# Patient Record
Sex: Female | Born: 1955 | Race: White | Hispanic: No | Smoking: Never smoker
Health system: Southern US, Community
[De-identification: ages and names within clinical notes are randomized; demographics above are authoritative.]

## PROBLEM LIST (undated history)

## (undated) DIAGNOSIS — F319 Bipolar disorder, unspecified: Secondary | ICD-10-CM

## (undated) DIAGNOSIS — G709 Myoneural disorder, unspecified: Secondary | ICD-10-CM

## (undated) DIAGNOSIS — I82409 Acute embolism and thrombosis of unspecified deep veins of unspecified lower extremity: Secondary | ICD-10-CM

## (undated) DIAGNOSIS — F419 Anxiety disorder, unspecified: Secondary | ICD-10-CM

## (undated) DIAGNOSIS — T7840XA Allergy, unspecified, initial encounter: Secondary | ICD-10-CM

## (undated) DIAGNOSIS — E876 Hypokalemia: Secondary | ICD-10-CM

## (undated) DIAGNOSIS — D689 Coagulation defect, unspecified: Secondary | ICD-10-CM

## (undated) DIAGNOSIS — I10 Essential (primary) hypertension: Secondary | ICD-10-CM

## (undated) DIAGNOSIS — E119 Type 2 diabetes mellitus without complications: Secondary | ICD-10-CM

## (undated) DIAGNOSIS — N189 Chronic kidney disease, unspecified: Secondary | ICD-10-CM

## (undated) DIAGNOSIS — E079 Disorder of thyroid, unspecified: Secondary | ICD-10-CM

## (undated) DIAGNOSIS — M1990 Unspecified osteoarthritis, unspecified site: Secondary | ICD-10-CM

## (undated) DIAGNOSIS — F32A Depression, unspecified: Secondary | ICD-10-CM

## (undated) DIAGNOSIS — IMO0002 Reserved for concepts with insufficient information to code with codable children: Secondary | ICD-10-CM

## (undated) HISTORY — DX: Coagulation defect, unspecified: D68.9

## (undated) HISTORY — DX: Anxiety disorder, unspecified: F41.9

## (undated) HISTORY — DX: Acute embolism and thrombosis of unspecified deep veins of unspecified lower extremity: I82.409

## (undated) HISTORY — PX: KNEE ARTHROSCOPY: SUR90

## (undated) HISTORY — DX: Myoneural disorder, unspecified: G70.9

## (undated) HISTORY — DX: Chronic kidney disease, unspecified: N18.9

## (undated) HISTORY — DX: Bipolar disorder, unspecified: F31.9

## (undated) HISTORY — DX: Depression, unspecified: F32.A

## (undated) HISTORY — DX: Type 2 diabetes mellitus without complications: E11.9

## (undated) HISTORY — DX: Allergy, unspecified, initial encounter: T78.40XA

## (undated) HISTORY — PX: KNEE SURGERY: SHX244

---

## 1999-08-12 ENCOUNTER — Emergency Department (HOSPITAL_COMMUNITY): Admission: EM | Admit: 1999-08-12 | Discharge: 1999-08-12 | Payer: Self-pay | Admitting: Emergency Medicine

## 2000-10-08 ENCOUNTER — Other Ambulatory Visit: Admission: RE | Admit: 2000-10-08 | Discharge: 2000-10-08 | Payer: Self-pay | Admitting: *Deleted

## 2003-02-08 ENCOUNTER — Other Ambulatory Visit: Admission: RE | Admit: 2003-02-08 | Discharge: 2003-02-08 | Payer: Self-pay | Admitting: Internal Medicine

## 2003-11-18 ENCOUNTER — Inpatient Hospital Stay (HOSPITAL_COMMUNITY): Admission: RE | Admit: 2003-11-18 | Discharge: 2003-11-24 | Payer: Self-pay | Admitting: Psychiatry

## 2004-05-09 ENCOUNTER — Other Ambulatory Visit: Admission: RE | Admit: 2004-05-09 | Discharge: 2004-05-09 | Payer: Self-pay | Admitting: Internal Medicine

## 2005-07-13 ENCOUNTER — Other Ambulatory Visit: Admission: RE | Admit: 2005-07-13 | Discharge: 2005-07-13 | Payer: Self-pay | Admitting: Internal Medicine

## 2005-11-28 ENCOUNTER — Emergency Department (HOSPITAL_COMMUNITY): Admission: EM | Admit: 2005-11-28 | Discharge: 2005-11-29 | Payer: Self-pay | Admitting: Emergency Medicine

## 2007-06-03 ENCOUNTER — Ambulatory Visit (HOSPITAL_COMMUNITY): Admission: RE | Admit: 2007-06-03 | Discharge: 2007-06-03 | Payer: Self-pay | Admitting: Obstetrics and Gynecology

## 2007-06-16 ENCOUNTER — Observation Stay (HOSPITAL_COMMUNITY): Admission: EM | Admit: 2007-06-16 | Discharge: 2007-06-23 | Payer: Self-pay | Admitting: Emergency Medicine

## 2007-06-24 ENCOUNTER — Encounter: Admission: RE | Admit: 2007-06-24 | Discharge: 2007-07-21 | Payer: Self-pay | Admitting: Endocrinology

## 2008-02-27 ENCOUNTER — Encounter: Payer: Self-pay | Admitting: Emergency Medicine

## 2008-02-27 ENCOUNTER — Inpatient Hospital Stay (HOSPITAL_COMMUNITY): Admission: AD | Admit: 2008-02-27 | Discharge: 2008-03-02 | Payer: Self-pay | Admitting: Obstetrics and Gynecology

## 2010-10-31 NOTE — Discharge Summary (Signed)
NAME:  Alison Cline, Alison Cline                 ACCOUNT NO.:  [id number]   MEDICAL RECORD NO.:  [id number]          PATIENT TYPE:  INP   LOCATION:  9320                          FACILITY:  [facility]   PHYSICIAN:  Randye Lobo, M.D.   DATE OF BIRTH:  June 17, 1956   DATE OF ADMISSION:  02/27/2008  DATE OF DISCHARGE:  03/02/2008                               DISCHARGE SUMMARY   ADMISSION DIAGNOSIS:  Right tuboovarian abscess.   DISCHARGE DIAGNOSES:  1. Right tuboovarian abscess.  2. Anemia.  3. Perimenopausal anovulatory bleeding.   ADMISSION HISTORY AND PHYSICAL EXAMINATION:  The patient is a 55-year-  old, para 3, perimenopausal female who presented to [facility]  with right lower quadrant pain, fever, and decreased appetite.  Approximately 2 weeks prior, the patient had undergone a saline  ultrasound in the office for a perimenopausal bleeding.  Approximately 1  week prior to admission, the patient was treated for dysuria and vaginal  discharge with Bactrim and Diflucan.   The patient's temperature at [facility] was noted to be 104  degrees Fahrenheit.   MEDICAL HISTORY:  Significant for hypothyroidism and bipolar disorder.   PAST SURGICAL HISTORY:  Significant for 3 prior cesarean sections and  bilateral tubal ligations.   PHYSICAL EXAMINATION:  ABDOMEN:  Soft and she had no evidence of  guarding.  She did have evidence of rebound in the right lower quadrant.  PELVIC:  The patient was noted to have a nonfoul-smelling pus from the  cervical os.  Cultures were done of the endometrium.  The patient had  tenderness and fluctuance in the right adnexal region.   The patient's white blood cell count was noted to be 15,000.   A CT scan documented a possible tuboovarian abscess.  The appendix  appeared to be somewhat inflamed, but the entire appendix could not be  visualized well.  Again, urinalysis documented white blood cells too  numerous to count and the presence of  bacteria.   HOSPITAL COURSE:  The patient was admitted for a suspected right  tuboovarian abscess.  She was started on ampicillin, gentamicin, and  clindamycin.  She had a followup pelvic ultrasound which documented a  right adnexal complex cyst measuring 6.1 x 4.9 x 3.1 cm.  The left ovary  was noted to be normal.  There were 2 fibroids in the uterus which  measured 2.7 and 1.7 cm in diameter.  The endometrial strip was 9 mm.  The final ultrasound report could not rule out a ruptured appendix.   The patient received IV narcotic pain control, and she had a general  surgery consult the following day by Dr. Abbey Chatters, who gave the  diagnosis of a right tuboovarian abscess as well.  He recommended a  percutaneous aspiration and drainage of the abscess if the patient did  not improve with antibiotic therapy.  An interventional radiologist  reviewed the CT scan, and thought the drainage would not be possible.  The patient's antibiotics were streamlined to include just Zosyn.   The patient continued to improve throughout her hospitalization.  Her  fever defervesced on the Zosyn therapy.  Her bowel function returned  with the administration of Senokot and Dulcolax, and the patient was  able to tolerate a regular diet.   The patient began having vaginal bleeding again during her  hospitalization.  She was started on Provera 10 mg p.o. daily.   The patient's blood cultures were negative to date upon her discharge.  Her endometrial culture documented normal vaginal flora.  Her urine  culture documented 60,000 colonies of mixed flora.  Her discharge  hemoglobin was noted to be 8.1, which was stable and well tolerated.   The patient was found to be in improved condition and ready for  discharge on March 02, 2008.   DISCHARGE INSTRUCTIONS:  1. Discharged to home.  2. The patient will take the following medications;      a.     Percocet 5/325 mg 1-2 p.o. q.4-6 h. p.r.n. pain.      b.      Ibuprofen 600 mg p.o. q.6 h. p.r.n. pain.      c.     Feosol 1 p.o. b.i.d. to t.i.d. as tolerated.      d.     Provera 10 mg p.o. daily x10 days.      e.     Flagyl 500 mg p.o. b.i.d. x2 weeks.      f.     Cefixime 200 mg p.o. b.i.d. x2 weeks.  3. The patient will follow a regular diet.  4. The patient will have no driving and no work for the next 1 week,      and the patient will not have sexual activity for the next 6 weeks.      5.  The patient will follow up in the office in 7-10 days.  5. The patient will call us if she experiences problems with recurrent      fever, nausea and vomiting, increased pain, heavy vaginal bleeding,      or any other concern.      Randye Lobo, M.D.  Electronically Signed     BES/MEDQ  D:  03/02/2008  T:  03/02/2008  Job:  [id number]

## 2010-10-31 NOTE — Consult Note (Signed)
NAME:  QUETZALI, HEINLE NO.:  [id number]   MEDICAL RECORD NO.:  [id number]          PATIENT TYPE:  OBV   LOCATION:  3020                         FACILITY:  [facility]   PHYSICIAN:  Cristi Loron, M.D.DATE OF BIRTH:  1956/03/01   DATE OF CONSULTATION:  06/18/2007  DATE OF DISCHARGE:                                 CONSULTATION   CHIEF COMPLAINT:  Back pain and left greater than right leg pain.   HISTORY OF PRESENT ILLNESS:  The patient is a 55 year old white female  who has had chronic intermittent back pain for 20 years.  She  described the intermittent episodes in which her back goes out.  Her  most recent episode began about 3 days ago when she bent over to pick  her grandson.  She had an acute onset of low back pain with pain into  her left greater than right buttocks.  The patient presented to the  emergency department 2 days ago with severe pain, was admitted for IV  pain control as well as further workup by Dr. Dagoberto Ligas.  The workup has  included a lumbar MRI scan, which demonstrated an annular tear at L4-L5  and herniated disc at L5-S1 and a neurosurgical consultation was  requested.   Presently, the patient is accompanied by her husband.  She complains of  midline low back pain at the lumbosacral junction with pain into her  left greater than right buttock.  She does not have any numbness or  tingling or pain radiating down her legs.  She feels in general she is  a little better.  She has not had any physical therapy or injections  into these except in the past, she has seen chiropractors intermittently  with some success.   PAST MEDICAL HISTORY:  Positive for bipolar disease, hypothyroidism, and  knee osteoarthritis.   PAST SURGICAL HISTORY:  Cesarean sections x3 and knee arthroscopy.   PRESENT MEDICATIONS:  1. Synthroid 50 mcg p.o. daily.  2. Demadex 20 mg p.o. daily.  3. K-Dur 20 mg p.o. daily.  4. Zyloprim 100 mg p.o. daily.  5. Wellbutrin 450 mg  p.o. daily.  6. Prozac 60 mg p.o. daily.  7. Sulfacetamide 10% ophthalmic solution 1 drop both eyes t.i.d.  8. MiraLax 17 g p.o. daily.  9. Xanax 1 mg p.o. p.r.n.  10.Restoril 60 mg p.o. p.r.n.  11.Morphine p.r.n.  12.Tylenol.  13.Klonopin 1 mg p.o. t.i.d.   DRUG ALLERGIES:  ZYPREXA.   FAMILY MEDICAL HISTORY:  The patient's mother is aged 12 in good health.  The patient's father is aged 20 with lung cancer.   SOCIAL HISTORY:  The patient is married.  She lives in [place].  She  has 3 children.  She is disabled secondary to her bipolar disorder.  She  denies tobacco or drug use.  She occasionally drinks alcohol.   REVIEW OF SYSTEMS:  Negative except as above.   PHYSICAL EXAMINATION:  GENERAL:  A pleasant 55 year old white female in  no apparent distress.  HEENT:  Normocephalic, atraumatic.  Pupils equal, round, and reactive to  light.  Extraocular muscles are intact.  Oropharynx benign.  Uvula  midline.  NECK:  Supple.  There are no masses, deformities, or tracheal deviation.  She has a fairly normal cervical range of motion.  Thorax is symmetric.  ABDOMEN:  Soft.  EXTREMITIES:  No obvious deformities.  BACK:  There is no point tenderness or deformities.  Pearlean Brownie testing is  negative bilaterally.  Straight leg raise testing is negative on the  right.  Positive on the left.  NEUROLOGIC:  The patient is alert and oriented x3.  Cranial nerves II  through XII are examined bilaterally and grossly normal.  Vision and  hearing are grossly normal bilaterally.  Motor strength is 5/5 in  bilateral deltoid, biceps, triceps, handgrip, wrist extensor, psoas,  quadriceps, gastrocnemius, and extensor hallucis longus.  Deep tendon  reflexes are 2/4 in bilateral biceps, triceps, brachioradialis,  quadriceps, and gastrocnemius.  There is no ankle clonus.  Cerebellar  function is intact to rapid alternating movements of the upper  extremities bilaterally.  Sensory exam is intact to light  touch  sensation in all tested dermatomes bilaterally.   IMAGING STUDIES:  I reviewed the patient's lumbar spine and x-rays  performed at [facility] on June 16, 2007; it demonstrates  the patient has mild to moderate thoracolumbar scoliosis, otherwise  unremarkable.   I also reviewed the patient's lumbar spine and MRI performed on June 17, 2007, without contrast at [facility].  The sagittal image  demonstrates that the patient has some diffuse mild bulging disc.  She  has annular tear at L4-L5 on the right.   On the axial images at L2-L3 and L3-L4, it demonstrates some minimal  central bulging; L4-L5 has a right-sided annular tear in the  neuroforamen.  She has a mild central bulging disc.  She has bilateral  facet arthropathy.  At L5-S1, there is a small to moderate left  herniated disc with some compression of the left thecal sac and left S1  nerve root and bilateral facet arthropathy.   ASSESSMENT AND PLAN:  Lumbago, lumbar radiculopathy, left L5-S1  herniated disc, right L4-L5 annular tear, facet arthropathy, disc  degeneration, etc.   I discussed the situation with the patient and her husband; (at the  patient's request), I described the above MRI findings.  I told her I  thought she is likely more symptomatic from the small to moderate left  herniated disc at L5-S1 suggesting left S1 radiculopathy, but she may  also be symptomatic from the annular tear at L4-L5 on the right causing  a right L4 radiculopathy.  In any event, there is no severe neural  compression, and she seems to be improving with medical management.  We  will start her on some IV and then p.o. steroids to help with any  inflammation and a muscle relaxer i.e., Flexeril.  We will continue with  pain medications as needed.  I will ask the physical therapist to work  with her and mobilize her.  I think she will likely improve with the  above measures.  If  not, another option would be  lumbar epidural injection.  If we can do  it, she may benefit from left L5-S1 microdiscectomy.  I have answered  all of the patient's questions.  I have given them my card to follow up  with me in the office after discharge.      Cristi Loron, M.D.  Electronically Signed     JDJ/MEDQ  D:  06/18/2007  T:  06/19/2007  Job:  [id number]

## 2010-10-31 NOTE — H&P (Signed)
NAME:  Alison Cline, Alison Cline                 ACCOUNT NO.:  [id number]   MEDICAL RECORD NO.:  [id number]          PATIENT TYPE:  INP   LOCATION:  3020                         FACILITY:  [facility]   PHYSICIAN:  Alfonse Alpers. Gegick, M.D.DATE OF BIRTH:  [birth year]   DATE OF ADMISSION:  06/16/2007  DATE OF DISCHARGE:                              HISTORY & PHYSICAL   CHIEF COMPLAINT:  This is a 55 year old woman who presents with a  history of severe back pain.   HISTORY OF PRESENT ILLNESS:  The patient has had a history of  intermittent back pain for approximately 20 years.  She has been seen by  a chiropractor who apparently treats her periodically.  She has not had  any severe back pains in the past.  Two days prior to this admission,  she was bending over to pick a child up, the child weighed approximately  25 pounds, and she had acute onset of back pain.  She was able to get  about that evening, but on the morning of this admission, the pain is  becoming quite severe, and she is unable to move.  She was brought into  the emergency room by EMS and is in severe pain and unable to move.  She  was invited to be discharged, however, she declined because of the  severe back pain.  She is unable to go to the bathroom.   PAST MEDICAL HISTORY:  Relatively benign.  She does have a history of  hypothyroidism which is compensated.  In addition, she has a history of  gout and bipolar disorder.   MEDICATIONS PRIOR TO THIS ADMISSION:  Include:  1. Temazepam 15 mg 4 h.s.  2. Synthroid 0.05 mg 1 q.d.  3. Demadex 20 mg 1 q.d.  4. Potassium 20 mEq 1 q.d.  5. Klonopin 1 mg 3 times a day p.r.n.  6. Allopurinol 100 mg 1 daily  7. Iron pills 3 times weekly  8. Wellbutrin 150 mg 3 daily.  9. Prozac 20 mg 3 times a day.  10.Xanax 1 mg 3 times a day p.r.n.  11.She is currently using eye drops for conjunctivitis.   PAST MEDICAL HISTORY:  Essentially negative except for a history of  thrombophlebitis.   ALLERGIES:   SHE IS ALLERGIC TO ZYPREXA.   REVIEW OF SYSTEMS:  Her weight has been stable.  Cardiovascular and  respiratory  are negative.  GI:  Negative.  GU:  Negative.   PHYSICAL EXAMINATION:  GENERAL:  This is a well-developed woman who  appears comfortable while lying down.  HEENT:  Her head is normocephalic.  NECK:  Supple.  LUNGS:  Clear.  CARDIOVASCULAR:  Rhythm is regular.  BACK:  Mild tenderness present in the L4/5 area.  Straight leg raising  is positive on the left.  On the right, she is able to move almost 90  degrees without discomfort.  Hip moves without difficulty.  No muscle  strength loss is present.  Reflexes are diminished.   IMPRESSION ON ADMISSION:  1. Acute back pain secondary to ruptured disk on the left.  2.  History of hypothyroidism.  3. History of gout.  4. History of bipolar disorder.           ______________________________  Alfonse Alpers. Dagoberto Ligas, M.D.     CGG/MEDQ  D:  06/16/2007  T:  06/17/2007  Job:  [id number]

## 2010-10-31 NOTE — Consult Note (Signed)
NAME:  Alison Cline, Alison Cline                 ACCOUNT NO.:  [id number]   MEDICAL RECORD NO.:  [id number]          PATIENT TYPE:  INP   LOCATION:  9320                          FACILITY:  [facility]   PHYSICIAN:  Adolph Pollack, M.D.DATE OF BIRTH:  [birth year]   DATE OF CONSULTATION:  02/28/2008  DATE OF DISCHARGE:                                 CONSULTATION   CONSULTING PHYSICIAN:  Randye Lobo, MD   REASON:  Right lower quadrant abscess, rule out ruptured appendix.   HISTORY OF PRESENT ILLNESS:  This is a 55 year old female with  approximately 7-day history of right lower quadrant and pelvic pain that  progressively worsened and was associated with fever.  The pain became  so bad that she presented to the hospital and was admitted on February 27, 2008.  She is noted to have a leukocytosis with a white blood cell  count of 15,000.  She had some nausea, no vomiting, and some  constipation.  She underwent a CT scan of the abdomen and pelvis.  This  demonstrated a right lower quadrant/pelvic inflammatory process.  There  appeared to hydrosalpinx as well.  Appendix could not be visualized.  The thought was this could be a pyosalpinx with tubal ovarian abscess,  but ruptured appendicitis cannot be ruled out.  I was asked to see her.  Currently, she states her fevers are better, but she is still having  some of the pain.   PAST MEDICAL HISTORY:  1. Bipolar disorder.  2. Hypothyroidism.  3. Hyperprolactinemia.  4. Herniated L5-S1 disk.  5. Left lower extremity deep venous thrombosis.   PREVIOUS OPERATIONS:  1. Three cesarean sections and with a tubal ligation.  2. Knee arthroscopy.   ALLERGIES:  Include ZYPREXA.   Medications at home include Abilify, Lexapro, potassium, Klonopin,  Synthroid, Wellbutrin, BuSpar, and Bactrim DS for recent urinary tract  infection.   SOCIAL HISTORY:  Married and is here with her husband.  Denies any  tobacco use.   REVIEW OF SYSTEMS:  CARDIOVASCULAR:  She  denies hypertension or heart  disease.  PULMONARY:  No pneumonia or shortness of breath.  GI:  No  peptic ulcer disease.  No diverticulitis.  No colitis.  No melena or  hematochezia.  GU:  She has no kidney stones and denies recurrent  urinary tract infections.  HEMATOLOGIC:  A left lower extremity DVT in  the past.  No bleeding disorders.   PHYSICAL EXAMINATION:  GENERAL:  A mildly overweight female in no acute  distress, very pleasant and cooperative, husband is in the room.  VITAL SIGNS:  Temperature is 97.7, blood pressure is 90/55, pulse 67, O2  saturations 100% on room air.  RESPIRATORY:  Breath sounds equal and clear.  Respirations unlabored.  CARDIOVASCULAR:  Regular rate and rhythm.  I do not hear a murmur.  ABDOMEN:  Soft with a low transverse scar present and hypoactive bowel  sounds noted.  There is right pelvic tenderness and guarding.  There is  mild right lower quadrant tenderness at McBurney point, but no guarding.  No masses were palpable.  LABORATORY DATA:  Notable for white blood cell count today of 13,600,  and hemoglobin 10.3.  Liver function tests normal except for low albumin  on admission.   CT scan was reviewed with radiologist.   IMPRESSION:  Right tubal ovarian abscess/pyosalpinx - I doubt the  ruptured appendicitis is the clinical presentation.  On the CT and most  of the process appears to be in the pelvic and adnexal area.   RECOMMENDATIONS:  I would change it to a single broad-spectrum  antibiotics such as Zosyn.  I would follow her clinical course and if  she is no better, I would recommend percutaneous aspiration or drainage.  If it did not improve with the antibiotics, we will repeat the CT scan  in March 02, 2008 or March 03, 2008.  I see no reason for the  need for emergency operative intervention at this time.       Adolph Pollack, M.D.  Electronically Signed     TJR/MEDQ  D:  02/28/2008  T:  02/29/2008  Job:  [id number]    cc:   Randye Lobo, M.D.  Fax: [phone]

## 2010-10-31 NOTE — Discharge Summary (Signed)
NAME:  Alison Cline, Alison Cline                 ACCOUNT NO.:  [id number]   MEDICAL RECORD NO.:  [id number]          PATIENT TYPE:  OBV   LOCATION:  3020                         FACILITY:  [facility]   PHYSICIAN:  Alfonse Alpers. Gegick, M.D.DATE OF BIRTH:  11/08/1955   DATE OF ADMISSION:  06/16/2007  DATE OF DISCHARGE:  06/22/2007                               DISCHARGE SUMMARY   HISTORY:  This is a 55 year old woman who presents with a history of  severe back pain.  She has had a history of chronic back pain for  approximately 20 years.  On the day prior to this admission, she had an  acute onset of pain while lifting up an object approximately 25 pounds.  The pain has persisted and has increased and she presents to the  emergency room with severe pain and unable to move.   She also has a history of bipolar disorder and she has been taking  medications for that.   She has a history of elevated prolactin levels.  Recently, a prolactin  level was 113 in the hospital.  Previously, she was evaluated by having  a MRI scan of the brain which was apparently negative.   Medications prior to this admission include temazepam 15 mg 4 at night,  Synthroid 0.5 mg 1 daily, Demadex 20 mg 1 daily, potassium 20 mEq 1  daily, Klonopin 1 mg 3 times a day as needed, allopurinol 100 mg 1  daily, iron pills 3 times weekly, Wellbutrin 150 mg 3 times daily,  Prozac 20 mg 3 times a day, Xanax 1 mg 3 times a day p.r.n., and she is  currently taking eye drops for recurrent conjunctivitis.   PHYSICAL EXAMINATION:  GENERAL:  Her physical examination revealed a  well-developed woman who IS experiencing severe pain.  LUNGS:  Her lungs were clear.  CARDIOVASCULAR: Examination was negative.  BACK:  Her back was mildly tender.  Straight leg raising was present  bilaterally.   IMPRESSION ON ADMISSION:  Ruptured disk.   HOSPITAL COURSE:  She was admitted to the hospital.  Her pain was so  severe that she was unable to go to the  bathroom and a Foley catheter  was inserted.  She was given morphine intravenously.  She continued to  have pain which was quite severe with some slight improvement.  She was  seen in consultation by Dr. Lovell Sheehan in the neurosurgical department.  His impression was that she had a lumbar radiculopathy with a left L5-S1  herniated disk and right L4-L5 annular tear.  She was started on  steroids and also Flexeril.  She had gradual improvement.  Physical  therapy was involved too and they continued to improve with physical  therapy.  She continued to improve and is at the point now where she is  able to get about and walk in the home with the presumption that she  will continue to improve.  The patient was then discharged.   DISCHARGE DIAGNOSES:  The diagnosis on discharge was:  (1) Ruptured  disk, L5-S1 and a right annular tear on L4-L5.  (2)  A history of  prolactin elevation.  (3) History of bipolar disorder.   MEDICATIONS ON DISCHARGE:  She will continue taking her usual  medications which include the following:  Synthroid 50 mcg p.o. daily,  Demadex 20 mg p.o. daily, K-Dur 20 mEq p.o. daily, allopurinol 100 mg  p.o. daily, Wellbutrin 150 mg 3 times a day, Prozac 20 mg 3 times a day,  Xanax 1 mg 3 times daily as needed.   DIET ON DISCHARGE:  Her diet on discharge to be as tolerated.   Her pain medication as an outpatient, she will receive Percocet and  prednisone will be tapered.   CONDITION ON DISCHARGE:  Improved.   PLANNED FOLLOWUP:  She was seen in the office in a period of 1 week.           ______________________________  Alfonse Alpers. Dagoberto Ligas, M.D.     CGG/MEDQ  D:  06/23/2007  T:  06/23/2007  Job:  [id number]

## 2010-11-03 NOTE — Discharge Summary (Signed)
NAME:  Alison Cline, Alison Cline NO.:  [id number]   MEDICAL RECORD NO.:  [id number]                   PATIENT TYPE:  IPS   LOCATION:  0501                                 FACILITY:  [facility]   PHYSICIAN:  Jeanice Lim, M.D.              DATE OF BIRTH:  [birth year]   DATE OF ADMISSION:  11/18/2003  DATE OF DISCHARGE:  11/24/2003                                 DISCHARGE SUMMARY   IDENTIFYING DATA:  This is a 55 year old married Caucasian female  voluntarily admitted with a history of depression, mood fluctuations.  Referred by psychiatrist for mood instability, getting worse over the past 3-  4 months, feeling depressed or irritable, decreased concentration, wanting  to overdose.  Had a manic episode.  Painted two apartments through the  night.  Unable to get mood stable.  Sleep is erratic, sleeping excessive  amounts and then going for days without sleep.   MEDICATIONS:  Ambien, Xanax, Effexor, Trileptal, Zomig for headache,  furosemide and K-Dur.   ALLERGIES:  No known drug allergies.   PHYSICAL EXAMINATION:  Physical exam and neurologically within normal  limits.   LABORATORY DATA:  Routine admission labs essentially within normal limits.   MENTAL STATUS EXAM:  Fully alert, pleasant, cooperative, some affective  lability.  Tearful at times.  No psychomotor abnormalities.  Speech within  normal limits.  Mood depressed, frustrated.  Affect somewhat labile.  Thought processes goal directed.  Positive for suicidal ideation with no  attempt at this time.  Cognitively intact.  Judgment and insight fair.   ADMISSION DIAGNOSES:   AXIS I:  Bipolar disorder, type 1, mixed.   AXIS II:  Deferred.   AXIS III:  1. History of migraine headaches.  2. Hypothyroidism.  3. Hypokalemia.   AXIS IV:  Moderate (stressors related to limited support system and other  psychosocial issues).   AXIS V:  29/60.   HOSPITAL COURSE:  The patient was admitted and ordered  routine p.r.n.  medications and underwent further monitoring.  Was encouraged to participate  in individual, group and milieu therapy.  The patient was placed on safety  checks for suicide prevention.  Baseline EKG was ordered.  Potassium was  replaced and routine admission labs were followed up to rule out a  reversible organic etiology of psychopathology.  The patient was adjusted on  medications.  Risk/benefit ratio and alternative treatments regarding  lithium were discussed with the patient.  The patient had significant mood  swings and was agreeable to take lithium with monitoring.  Lithium was added  and Klonopin used for acute anxiety and Trileptal was discontinued.  Gradually, patient's potassium was replaced and diarrhea also resolved.  The  patient reported significant improvement.  The patient was discharged with a  mood that was stable and euthymic.  Affect brighter.  Improved judgment and  insight.  No psychotic symptoms.  No dangerous ideation.  Reporting  motivation to be compliant with the follow-up plan, showing increased  insight regarding the importance of behavioral changes as well to further  maintain mood stability.  The patient was given medication education and,  again, reported the dangerousness of lithium and importance of close  monitoring.   DISCHARGE MEDICATIONS:  1. Potassium 20 mEq three times a day.  2. Klonopin 0.5 mg, 1/2-1 at bedtime and 1/2 tablet per day as needed for     anxiety.  3. Effexor XR 75 mg q.a.m.  4. Seroquel 100 mg q.h.s.  5. Flexeril 10 mg, 1/2 q.h.s. p.r.n. muscle spasms.  6. Lithium carbonate 450 mg, 1 q.h.s.  7. Lithium carbonate 300 mg, 1 q.a.m.  8. The patient was to continue Demadex and Synthroid and other medications     prescribed by medical doctor.   FOLLOW UP:  The patient was to see family physician for follow-up regarding  potassium within a week and Dr. Milford Cage for continued medication  monitoring.  Appointment  was made for December 01, 2003 at 9:40.   DISCHARGE DIAGNOSES:   AXIS I:  Bipolar disorder, type 1, mixed.   AXIS II:  Deferred.   AXIS III:  1. History of migraine headaches.  2. Hypothyroidism.  3. Hypokalemia.   AXIS IV:  Moderate (stressors related to limited support system and other  psychosocial issues).   AXIS V:  Global Assessment of Functioning on discharge 55.                                               Jeanice Lim, M.D.    JEM/MEDQ  D:  12/12/2003  T:  12/12/2003  Job:  [id number]

## 2011-03-19 LAB — BASIC METABOLIC PANEL
GFR calc Af Amer: >60
Potassium: 4.1

## 2011-03-19 LAB — CBC
Hemoglobin: 8.1 — ABNORMAL LOW
MCHC: 33.3
Platelets: 225

## 2011-03-19 LAB — DIFFERENTIAL
Basophils Relative: 1
Eosinophils Absolute: 0.2
Lymphocytes Relative: 26
Neutro Abs: 3.6
Neutrophils Relative %: 62

## 2011-03-21 LAB — COMPREHENSIVE METABOLIC PANEL
AST: 28
Albumin: 2.6 — ABNORMAL LOW
Alkaline Phosphatase: 70
Alkaline Phosphatase: 77
BUN: 8
CO2: 27
Calcium: 8.5
Chloride: 102
Creatinine, Ser: 1.07
GFR calc non Af Amer: 54 — ABNORMAL LOW
Glucose, Bld: 114 — ABNORMAL HIGH
Potassium: 3.9
Sodium: 134 — ABNORMAL LOW
Sodium: 136
Total Bilirubin: 0.6
Total Protein: 6.5
Total Protein: 7.2

## 2011-03-21 LAB — URINE CULTURE
Colony Count: NO GROWTH
Culture: NO GROWTH
Special Requests: NEGATIVE

## 2011-03-21 LAB — CBC
HCT: 24.6 — ABNORMAL LOW
HCT: 34.9 — ABNORMAL LOW
MCHC: 32.7
MCHC: 33.1
MCHC: 33.5
MCV: 85.4
MCV: 86.4
Platelets: 197
Platelets: 222
RBC: 3.02 — ABNORMAL LOW
RDW: 16.3 — ABNORMAL HIGH
RDW: 16.6 — ABNORMAL HIGH
WBC: 13.6 — ABNORMAL HIGH

## 2011-03-21 LAB — DIFFERENTIAL
Basophils Relative: 0
Basophils Relative: 0
Basophils Relative: 0
Eosinophils Absolute: 0.1
Eosinophils Absolute: 0.2
Eosinophils Relative: 1
Lymphocytes Relative: 5 — ABNORMAL LOW
Lymphs Abs: 1.5
Lymphs Abs: 1.6
Monocytes Relative: 6
Monocytes Relative: 8
Monocytes Relative: 8
Neutro Abs: 13.2 — ABNORMAL HIGH
Neutrophils Relative %: 65
Neutrophils Relative %: 69
Neutrophils Relative %: 88 — ABNORMAL HIGH

## 2011-03-21 LAB — CULTURE, BLOOD (ROUTINE X 2): Culture: NO GROWTH

## 2011-03-21 LAB — URINALYSIS, ROUTINE W REFLEX MICROSCOPIC
Bilirubin Urine: NEGATIVE
Protein, ur: 30 — AB
Urobilinogen, UA: 0.2

## 2011-03-21 LAB — BASIC METABOLIC PANEL
BUN: 3 — ABNORMAL LOW
BUN: 4 — ABNORMAL LOW
CO2: 30
Calcium: 8.1 — ABNORMAL LOW
Chloride: 104
Creatinine, Ser: 1.1
GFR calc non Af Amer: 52 — ABNORMAL LOW
GFR calc non Af Amer: >60
Glucose, Bld: 107 — ABNORMAL HIGH
Glucose, Bld: 98
Potassium: 3.8
Sodium: 139

## 2011-03-21 LAB — URINE MICROSCOPIC-ADD ON

## 2011-03-21 LAB — GENTAMICIN LEVEL, RANDOM: Gentamicin Rm: 4.8

## 2011-03-21 LAB — CULTURE, ROUTINE-GENITAL: Culture: NORMAL

## 2012-02-07 ENCOUNTER — Ambulatory Visit (INDEPENDENT_AMBULATORY_CARE_PROVIDER_SITE_OTHER): Payer: Medicare Other | Admitting: Obstetrics and Gynecology

## 2012-02-07 ENCOUNTER — Encounter: Payer: Self-pay | Admitting: Obstetrics and Gynecology

## 2012-02-07 ENCOUNTER — Telehealth: Payer: Self-pay | Admitting: Obstetrics and Gynecology

## 2012-02-07 VITALS — BP 102/68 | HR 70 | Ht 63.0 in | Wt 167.0 lb

## 2012-02-07 DIAGNOSIS — Q828 Other specified congenital malformations of skin: Secondary | ICD-10-CM

## 2012-02-07 DIAGNOSIS — B379 Candidiasis, unspecified: Secondary | ICD-10-CM

## 2012-02-07 DIAGNOSIS — N390 Urinary tract infection, site not specified: Secondary | ICD-10-CM

## 2012-02-07 DIAGNOSIS — Z01419 Encounter for gynecological examination (general) (routine) without abnormal findings: Secondary | ICD-10-CM

## 2012-02-07 DIAGNOSIS — Z124 Encounter for screening for malignant neoplasm of cervix: Secondary | ICD-10-CM

## 2012-02-07 DIAGNOSIS — B49 Unspecified mycosis: Secondary | ICD-10-CM

## 2012-02-07 MED ORDER — IMIQUIMOD 5 % EX CREA: TOPICAL_CREAM | CUTANEOUS | Status: DC

## 2012-02-07 MED ORDER — FLUCONAZOLE 150 MG PO TABS: 150.0000 mg | ORAL_TABLET | Freq: Every day | ORAL | Status: AC

## 2012-02-07 MED ORDER — NITROFURANTOIN MACROCRYSTAL 50 MG PO CAPS: 50.0000 mg | ORAL_CAPSULE | Freq: Every day | ORAL | Status: AC

## 2012-02-07 NOTE — Progress Notes (Signed)
Regular Periods: no Mammogram: yes  Monthly Breast Ex.: yes Exercise: yes  Tetanus < 10 years: no Seatbelts: yes  NI. Bladder Functn.: no Pt has had bladder infections over the past few months Abuse at home: no  Daily BM's: yes Stressful Work: no  Healthy Diet: yes Sigmoid-Colonoscopy: within the last year  Calcium: yes Medical problems this year: none   LAST PAP:09/2010  Contraception: BTL  Mammogram:  2012 per pt  PCP:Dr. Gaye Alken  PMH: none  FMH: none  Last Bone Scan: n/a  Subjective:    Alison Cline is a 56 y.o. female G3P3 who presents for annual exam. The patient complains of urinary tract infections after intercourse. She complains of skin tags between her legs.  Her mother recently died.  She is now divorced.  Her younger sister died from colon cancer.  The following portions of the patient's history were reviewed and updated as appropriate: allergies, current medications, past family history, past medical history, past social history, past surgical history and problem list. See above.  Review of Systems Pertinent items are noted in HPI. Gastrointestinal:No change in bowel habits, no abdominal pain, no rectal bleeding Genitourinary:negative for dysuria, frequency, hematuria, nocturia and urinary incontinence    Objective:     BP 102/68  Pulse 70  Ht 5\' 3"  (1.6 m)  Wt 167 lb (75.751 kg)  BMI 29.58 kg/m2  Weight:  Wt Readings from Last 1 Encounters:  02/07/12 167 lb (75.751 kg)     BMI: Body mass index is 29.58 kg/(m^2). General Appearance: Alert, appropriate appearance for age. No acute distress HEENT: Grossly normal Neck / Thyroid: Supple, no masses, nodes or enlargement Lungs: clear to auscultation bilaterally Back: No CVA tenderness Breast Exam: No masses or nodes.No dimpling, nipple retraction or discharge. Cardiovascular: Regular rate and rhythm. S1, S2, no murmur Gastrointestinal: Soft, non-tender, no masses or  organomegaly  ++++++++++++++++++++++++++++++++++++++++++++++++++++++++  Pelvic Exam: External genitalia: normal general appearance Vaginal: normal without tenderness, induration or masses and relaxation noted Cervix: normal appearance Adnexa: normal bimanual exam Uterus: upper limits normal size Rectovaginal: normal rectal, no masses  ++++++++++++++++++++++++++++++++++++++++++++++++++++++++  Lymphatic Exam: Non-palpable nodes in neck, clavicular, axillary, or inguinal regions  Psychiatric: Alert and oriented, appropriate affect.    Urinalysis:Not done      Assessment:    Normal gyn exam   Post coital urinary tract infections  Skin tags  Overweight or obese: Yes  Pelvic relaxation: Yes  Menopausal symptoms: No. Severe: No.   Plan:    Mammogram. Pap smear. patient needs repeat colonoscopy   Aldara to skin tags.  Patient understands that this medication is normally used for condylomata.  Nitrofurantoin 50 mg prior to intercourse.  Diflucan if needed for yeast infection.  Follow-up:  for annual exam  STD screen request: none,   The updated Pap smear screening guidelines were discussed with the patient. The patient requested that I obtain a Pap smear: Yes.  Kegel exercises discussed: Yes.  Proper diet and regular exercise were reviewed.  Annual mammograms recommended starting at age 15. Proper breast care was discussed.  Screening colonoscopy is recommended beginning at age 22.  Regular health maintenance was reviewed.  Mylinda Latina.D.

## 2012-02-07 NOTE — Telephone Encounter (Signed)
AVS,THIS PT SAW YOU TODAY AND SHE STATES SHE HAD A LOT OF QUESTIONS FOR YOU TODAY ,BUT THE ONE QUESTION SHE FORGOT TO ASK YOU WAS SHE IS 56 YEARS OLD AND X 3 MONTHS SHE HAS BEEN HAVING SOME PAIN WITH INTERCOURSE AND WANT TO KNOW WHAT ARE YOUR THOUGHTS ON THAT SUBJECT. PT STATES LUBRICATION IS NOT THE PROBLEM AND SHE JUST WANTED TO SPEAK WITH YOU AGAIN IF POSSIBLE.

## 2012-02-07 NOTE — Telephone Encounter (Signed)
TRIAGE/GENERAL QUEST./ISSUE HAS BEEN ADDRESSED

## 2012-02-08 LAB — PAP IG W/ RFLX HPV ASCU

## 2012-02-09 NOTE — Telephone Encounter (Signed)
Menopause, lack of estrogen in the vagina.  Dr. Stefano Gaul

## 2012-02-11 NOTE — Telephone Encounter (Signed)
TC TO PT REGARDING QUESTION SHE HAD FOR AVS. PER AVS INFORMED PT THAT THE PAIN DURING I/C CAN BE FROM THE LACK OF ESTROGEN IN THE VAGINA DUE TO MENOPAUSE.  I TOLD PT TO TRY [place] JELLY OR INTRIGUE TO HELP WITH LUBRICATION AND IF IT DID NOT WORK TO COME IN TO BE EVAL FOR VAGINAL DRYNESS. PT VOICED UNDERSTANDING.

## 2012-02-14 ENCOUNTER — Telehealth: Payer: Self-pay | Admitting: Obstetrics and Gynecology

## 2012-02-14 NOTE — Telephone Encounter (Signed)
Patient will be called with her appointment to come in for TCA treatment.  Dr. Stefano Gaul

## 2012-02-26 ENCOUNTER — Telehealth: Payer: Self-pay | Admitting: Obstetrics and Gynecology

## 2012-02-26 NOTE — Telephone Encounter (Signed)
TRIAGE/BC

## 2012-02-27 ENCOUNTER — Telehealth: Payer: Self-pay

## 2012-02-27 NOTE — Telephone Encounter (Signed)
Per AVS pt can come in the office for skin tags. Pt called and made aware. Pt has appt scheduled for 9-16-*13 @ 11:30am. Pt voiced understanding.  [facility] CMA

## 2012-02-28 ENCOUNTER — Telehealth: Payer: Self-pay | Admitting: Obstetrics and Gynecology

## 2012-02-28 NOTE — Telephone Encounter (Signed)
Triage/tst req

## 2012-02-28 NOTE — Telephone Encounter (Signed)
Lm on vm tcb rgd msg 

## 2012-03-03 ENCOUNTER — Encounter: Payer: Self-pay | Admitting: Obstetrics and Gynecology

## 2012-03-03 ENCOUNTER — Ambulatory Visit (INDEPENDENT_AMBULATORY_CARE_PROVIDER_SITE_OTHER): Payer: Medicare Other | Admitting: Obstetrics and Gynecology

## 2012-03-03 VITALS — BP 120/64 | Resp 18 | Wt 172.0 lb

## 2012-03-03 DIAGNOSIS — A630 Anogenital (venereal) warts: Secondary | ICD-10-CM

## 2012-03-03 NOTE — Progress Notes (Addendum)
HISTORY OF PRESENT ILLNESS  [name] is a 56 y.o. year old female,G3P3, who presents for a problem visit. 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  Subjective:  The patient was told that her prescription for Aldara will cost $400. She says that she cannot afford this.  Objective:  BP 120/64  Resp 18  Wt 172 lb (78.019 kg)   General: no distress  External genitalia: warts noted at the right upper labia minor normal.  Procedure:  Procedure discussed with the patient.  Vaseline was placed on the normal skin surrounding the condylomata.  15% TCA was placed on the lesions.  The patient tolerated her procedure well.  Assessment:  condylomata  Plan:  The patient will return in one to 2 weeks if the lesions have not resolved.  Return to office prn if symptoms worsen or fail to improve.   Leonard Schwartz M.D.  03/03/2012 12:42 PM

## 2012-03-04 ENCOUNTER — Encounter (HOSPITAL_BASED_OUTPATIENT_CLINIC_OR_DEPARTMENT_OTHER): Payer: Self-pay | Admitting: *Deleted

## 2012-03-04 ENCOUNTER — Emergency Department (HOSPITAL_BASED_OUTPATIENT_CLINIC_OR_DEPARTMENT_OTHER)
Admission: EM | Admit: 2012-03-04 | Discharge: 2012-03-05 | Disposition: A | Discharge status: Home / Self Care | Payer: Medicare Other | Attending: Emergency Medicine | Admitting: Emergency Medicine

## 2012-03-04 DIAGNOSIS — Z888 Allergy status to other drugs, medicaments and biological substances status: Secondary | ICD-10-CM | POA: Insufficient documentation

## 2012-03-04 DIAGNOSIS — X58XXXA Exposure to other specified factors, initial encounter: Secondary | ICD-10-CM | POA: Insufficient documentation

## 2012-03-04 DIAGNOSIS — T783XXA Angioneurotic edema, initial encounter: Secondary | ICD-10-CM

## 2012-03-04 DIAGNOSIS — F319 Bipolar disorder, unspecified: Secondary | ICD-10-CM | POA: Insufficient documentation

## 2012-03-04 MED ORDER — FAMOTIDINE 20 MG PO TABS
40.0000 mg | ORAL_TABLET | Freq: Once | ORAL | Status: AC
  Administered 2012-03-05: 40 mg via ORAL
  Filled 2012-03-04 (×2): qty 1

## 2012-03-04 MED ORDER — DIPHENHYDRAMINE HCL 25 MG PO CAPS
50.0000 mg | ORAL_CAPSULE | Freq: Once | ORAL | Status: AC
  Administered 2012-03-05: 50 mg via ORAL
  Filled 2012-03-04: qty 2

## 2012-03-04 NOTE — ED Notes (Signed)
Started on neurotin 2 weeks ago, pt started to develop raw swollen tongue yesterday,  And today became considered when it continued to swell. Pt was seen by pcp today and sent her for iv steroids. Airway intact, pt does report difficulty swallowing

## 2012-03-04 NOTE — ED Notes (Addendum)
Yesterday her tongue was sore. Today she has had swelling of her tongue. Was seen by her MD before coming here and given Prednisone for angioedema.

## 2012-03-04 NOTE — ED Provider Notes (Signed)
History     CSN: [id number]  Arrival date & time 03/04/12  2111   First MD Initiated Contact with Patient 03/04/12 2316      Chief Complaint  Patient presents with  . Oral Swelling    (Consider location/radiation/quality/duration/timing/severity/associated sxs/prior treatment) HPI This is a 56 year old white female who complains of swelling of the tongue. She noticed some oral discomfort yesterday. This morning about 6 AM she felt sensation that her tongue was swelling and it gradually and reached throughout the day. She was having difficulty swallowing or speaking due to this. She contacted her dentist who recommended some treatment for dry mouth which did not improve her symptoms. She was seen at an [place] walk-in clinic given intramuscular Solu-Medrol 40 mg. She has had significant improvement since that time. She was told she needed to come here for IV medications including Benadryl. She denies any associated nausea, vomiting, diarrhea, dyspnea or wheezing. She is not on an ACE inhibitor or angiotensin receptor blocker.  Past Medical History  Diagnosis Date  . Bipolar disorder     History reviewed. No pertinent past surgical history.  No family history on file.  History  Substance Use Topics  . Smoking status: Never Smoker   . Smokeless tobacco: Not on file  . Alcohol Use: No    OB History    Grav Para Term Preterm Abortions TAB SAB Ect Mult Living   3 3        3       Review of Systems  All other systems reviewed and are negative.    Allergies  Ambien and Zyprexa  Home Medications   Current Outpatient Rx  Name Route Sig Dispense Refill  . ALPRAZOLAM 2 MG PO TABS Oral Take 2 mg by mouth at bedtime as needed. Pt takes 1/2 am, 1/2 lunch, 1 whole tablet at bedtime    . AMPHETAMINE-DEXTROAMPHET ER 20 MG PO CP24 Oral Take 20 mg by mouth daily as needed.    Marland Kitchen CALCIUM-VITAMIN D 600-400 MG-UNIT PO TABS Oral Take by mouth.    Marland Kitchen CALCIUM CARBONATE 600 MG PO TABS Oral  Take 600 mg by mouth 2 (two) times daily with a meal.    . LAMOTRIGINE 100 MG PO TABS Oral Take 100 mg by mouth 2 (two) times daily. Anti-seizure medication.  Consult needed.    Marland Kitchen LEVOTHYROXINE SODIUM 75 MCG PO TABS Oral Take 75 mcg by mouth daily.    . CENTRUM SILVER ULTRA WOMENS PO Oral Take by mouth.    Marland Kitchen OVER THE COUNTER MEDICATION  Pt takes Mushroom Complete 8 bid    . POTASSIUM CHLORIDE 20 MEQ PO PACK Oral Take 20 mEq by mouth daily.    Marland Kitchen TEMAZEPAM 30 MG PO CAPS Oral Take 30 mg by mouth at bedtime as needed.    . TORSEMIDE 20 MG PO TABS Oral Take 20 mg by mouth daily.    . TRAZODONE HCL 50 MG PO TABS Oral Take 50 mg by mouth at bedtime.    Marland Kitchen METFORMIN HCL 500 MG PO TABS Oral Take 500 mg by mouth 2 (two) times daily with a meal.    . SIMVASTATIN 10 MG PO TABS Oral Take 10 mg by mouth at bedtime.      BP 142/72  Pulse 62  Temp 98.2 F (36.8 C) (Oral)  Resp 20  SpO2 100%  Physical Exam General: Well-developed, well-nourished female in no acute distress; appearance consistent with age of record HENT: normocephalic, atraumatic; mild angioedema  of tongue; normal voice Eyes: pupils equal round and reactive to light; extraocular muscles intact Neck: supple Heart: regular rate and rhythm Lungs: clear to auscultation bilaterally Abdomen: soft; nondistended; nontender; bowel sounds present Extremities: No deformity; full range of motion Neurologic: Awake, alert and oriented; motor function intact in all extremities and symmetric; no facial droop Skin: Warm and dry Psychiatric: Normal mood and affect    ED Course  Procedures (including critical care time)     MDM  12:48 AM Continued improvement after Benadryl and Pepcid. The trigger for this is not obvious but likely allergic. She has a history of angioedema. As noted she is not on ACE inhibitor or angiotensin receptor blocker.         Hanley Seamen, MD 03/05/12 [phone]

## 2012-03-05 MED ORDER — FAMOTIDINE 20 MG PO TABS: ORAL_TABLET | ORAL | Status: DC

## 2012-03-05 MED ORDER — PREDNISONE 50 MG PO TABS: ORAL_TABLET | ORAL | Status: DC

## 2012-03-05 MED ORDER — DIPHENHYDRAMINE HCL 50 MG PO CAPS: ORAL_CAPSULE | ORAL | Status: DC

## 2012-03-13 ENCOUNTER — Other Ambulatory Visit: Payer: Self-pay | Admitting: Family Medicine

## 2012-03-13 DIAGNOSIS — Z1231 Encounter for screening mammogram for malignant neoplasm of breast: Secondary | ICD-10-CM

## 2012-04-02 ENCOUNTER — Ambulatory Visit
Admission: RE | Admit: 2012-04-02 | Discharge: 2012-04-02 | Disposition: A | Discharge status: Home / Self Care | Payer: Medicare Other | Source: Ambulatory Visit | Attending: Family Medicine | Admitting: Family Medicine

## 2012-04-02 DIAGNOSIS — Z1231 Encounter for screening mammogram for malignant neoplasm of breast: Secondary | ICD-10-CM

## 2012-04-08 ENCOUNTER — Other Ambulatory Visit: Payer: Self-pay | Admitting: Family Medicine

## 2012-04-08 DIAGNOSIS — R928 Other abnormal and inconclusive findings on diagnostic imaging of breast: Secondary | ICD-10-CM

## 2012-04-10 ENCOUNTER — Ambulatory Visit
Admission: RE | Admit: 2012-04-10 | Discharge: 2012-04-10 | Disposition: A | Discharge status: Home / Self Care | Payer: Medicare Other | Source: Ambulatory Visit | Attending: Family Medicine | Admitting: Family Medicine

## 2012-04-10 DIAGNOSIS — R928 Other abnormal and inconclusive findings on diagnostic imaging of breast: Secondary | ICD-10-CM

## 2012-05-01 ENCOUNTER — Other Ambulatory Visit: Payer: Self-pay | Admitting: *Deleted

## 2012-05-01 DIAGNOSIS — I83893 Varicose veins of bilateral lower extremities with other complications: Secondary | ICD-10-CM

## 2012-05-28 ENCOUNTER — Encounter: Payer: Self-pay | Admitting: Vascular Surgery

## 2012-05-29 ENCOUNTER — Encounter (INDEPENDENT_AMBULATORY_CARE_PROVIDER_SITE_OTHER): Payer: Medicare Other | Admitting: *Deleted

## 2012-05-29 ENCOUNTER — Ambulatory Visit (INDEPENDENT_AMBULATORY_CARE_PROVIDER_SITE_OTHER): Payer: Medicare Other | Admitting: Vascular Surgery

## 2012-05-29 ENCOUNTER — Encounter: Payer: Self-pay | Admitting: Vascular Surgery

## 2012-05-29 VITALS — BP 133/82 | HR 76 | Resp 16 | Ht 63.0 in | Wt 160.6 lb

## 2012-05-29 DIAGNOSIS — I83893 Varicose veins of bilateral lower extremities with other complications: Secondary | ICD-10-CM

## 2012-05-29 NOTE — Progress Notes (Signed)
[facility] [place] HISTORY AND PHYSICAL   History of Present Illness:  Patient is a 56 y.o. year old female who presents for evaluation of painful varicose veins with some numbness on the right aspect of her leg.  The patient states she has had these symptoms for approximately 6 months. She does have a history of a thrombophlebitis in 1975 after a leg injury. She has a history of chronic back pain. She is followed by a chiropractor. She denies any nonhealing ulcers. She denies history of smoking. She denies hypercoagulable state. She does have a family history of varicose veins in her mother. Other medical problems include bipolar disorder, diabetes. These are currently stable.  Past Medical History  Diagnosis Date  . Bipolar disorder   . Diabetes mellitus without complication   . DVT (deep venous thrombosis)     Past Surgical History  Procedure Date  . Cesarean section     X3  . Knee surgery      Social History History  Substance Use Topics  . Smoking status: Never Smoker   . Smokeless tobacco: Never Used  . Alcohol Use: No     Comment: stopped using in 12/2011    Family History Family History  Problem Relation Age of Onset  . Cancer Mother     lukemia  . Hypertension Mother   . Other Mother     varicose veins  . Cancer Father     prostate  . Cancer Sister     colon  . Cancer Brother     renal  . Heart attack Brother     X2  . Heart disease Brother   . Hypertension Brother   . Heart attack Brother   . Heart disease Brother     Allergies  Allergies  Allergen Reactions  . Ambien (Zolpidem) Other (See Comments)    Sleep walk, eat, and drive.  . Zyprexa (Olanzapine) Itching and Swelling    Causes throat to swell     Current Outpatient Prescriptions  Medication Sig Dispense Refill  . alprazolam (XANAX) 2 MG tablet Take 2 mg by mouth at bedtime as needed. Pt takes 1/2 am, 1/2 lunch, 1 whole tablet at bedtime      .  amphetamine-dextroamphetamine (ADDERALL XR) 20 MG 24 hr capsule Take 20 mg by mouth daily as needed.      . Calcium Carb-Cholecalciferol (CALCIUM-VITAMIN D) 600-400 MG-UNIT TABS Take by mouth.      . calcium carbonate (OS-CAL) 600 MG TABS Take 600 mg by mouth 2 (two) times daily with a meal.      . lamoTRIgine (LAMICTAL) 100 MG tablet Take 100 mg by mouth 2 (two) times daily. Anti-seizure medication.  Consult needed.      Marland Kitchen levothyroxine (SYNTHROID, LEVOTHROID) 75 MCG tablet Take 75 mcg by mouth daily.      . Multiple Vitamins-Minerals (CENTRUM SILVER ULTRA WOMENS PO) Take by mouth.      Marland Kitchen OVER THE COUNTER MEDICATION Pt takes Mushroom Complete 8 bid      . potassium chloride (KLOR-CON) 20 MEQ packet Take 20 mEq by mouth daily.      Marland Kitchen rOPINIRole (REQUIP) 0.25 MG tablet Take 0.25 mg by mouth 2 (two) times daily.      . temazepam (RESTORIL) 30 MG capsule Take 30 mg by mouth at bedtime as needed.      . torsemide (DEMADEX) 20 MG tablet Take 20 mg by mouth daily.      Marland Kitchen  traZODone (DESYREL) 50 MG tablet Take 50 mg by mouth at bedtime.      . diphenhydrAMINE (BENADRYL) 50 MG capsule Take 2 capsules every 6 hours for the next 3 days then as needed for tongue swelling.      . famotidine (PEPCID) 20 MG tablet Take 1 tablet twice daily for the next 3 days then as needed for tongue swelling.      . metFORMIN (GLUCOPHAGE) 500 MG tablet Take 500 mg by mouth 2 (two) times daily with a meal.      . predniSONE (DELTASONE) 50 MG tablet Take tablet on the afternoon of Wednesday, September 18.  1 tablet  0  . simvastatin (ZOCOR) 10 MG tablet Take 10 mg by mouth at bedtime.        ROS:   General:  No weight loss, Fever, chills  HEENT: No recent headaches, no nasal bleeding, no visual changes, no sore throat  Neurologic: No dizziness, blackouts, seizures. No recent symptoms of stroke or mini- stroke. No recent episodes of slurred speech, or temporary blindness.  Cardiac: No recent episodes of chest  pain/pressure, no shortness of breath at rest.  No shortness of breath with exertion.  Denies history of atrial fibrillation or irregular heartbeat  Vascular: No history of rest pain in feet.  No history of claudication.  No history of non-healing ulcer, No history of DVT   Pulmonary: No home oxygen, no productive cough, no hemoptysis,  No asthma or wheezing  Musculoskeletal:  [ ]  Arthritis, [ x] Low back pain,  [ ]  Joint pain  Hematologic:No history of hypercoagulable state.  No history of easy bleeding.  No history of anemia  Gastrointestinal: No hematochezia or melena,  No gastroesophageal reflux, no trouble swallowing  Urinary: [ ]  chronic Kidney disease, [ ]  on HD - [ ]  MWF or [ ]  TTHS, [ ]  Burning with urination, [ ]  Frequent urination, [ ]  Difficulty urinating;   Skin: No rashes  Psychological: No history of anxiety,  No history of depression   Physical Examination  Filed Vitals:   05/29/12 1103  BP: 133/82  Pulse: 76  Resp: 16  Height: 5\' 3"  (1.6 m)  Weight: 160 lb 9.6 oz (72.848 kg)  SpO2: 100%    Body mass index is 28.45 kg/(m^2).  General:  Alert and oriented, no acute distress HEENT: Normal Neck: No bruit or JVD Pulmonary: Clear to auscultation bilaterally Cardiac: Regular Rate and Rhythm without murmur Abdomen: Soft, non-tender, non-distended, no mass Skin: No rash, scattered 1-3 mm diameter varicosities left popliteal fossa and left lateral segment of knee, few scattered small spider-type varicosities right anterior thigh Extremity Pulses:  2+ radial, brachial, femoral, dorsalis pedis, posterior tibial pulses bilaterally Musculoskeletal: No deformity or edema  Neurologic: Upper and lower extremity motor 5/5 and symmetric  DATA: The patient had a venous duplex exam today which I reviewed and interpreted. This showed no evidence of DVT. She did have mild reflux of the right common femoral vein in the right saphenofemoral junction. However the remainder of the  venous system on the right side was competent. She did have a short segment of reflux in the distal left was greater saphenous vein that overall this was a fairly normal study.  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 Mild evidence of superficial venous reflux but not severe enough to warrant ablation.  I do not believe these are the cause of the symptoms on her right anterior thigh.  PLAN:  Pathophysiology of venous reflux was discussed with the patient today. I agree that she should be and lower extremity compression stockings. We also had her veins nurse discuss with her the possibility of sclerotherapy for her reticular and spider veins. She will followup with her veins nurse if she wishes treatment of these in the future. Otherwise she will followup on as-needed basis.  Fabienne Bruns, MD [facility] [place] Office: [phone] Pager: [phone]

## 2012-06-03 ENCOUNTER — Other Ambulatory Visit: Payer: Self-pay | Admitting: Neurosurgery

## 2012-06-03 DIAGNOSIS — M545 Low back pain: Secondary | ICD-10-CM

## 2012-06-27 ENCOUNTER — Ambulatory Visit
Admission: RE | Admit: 2012-06-27 | Discharge: 2012-06-27 | Disposition: A | Discharge status: Home / Self Care | Payer: Medicare Other | Source: Ambulatory Visit | Attending: Neurosurgery | Admitting: Neurosurgery

## 2012-06-27 DIAGNOSIS — M545 Low back pain: Secondary | ICD-10-CM

## 2012-08-28 ENCOUNTER — Telehealth: Payer: Self-pay | Admitting: Obstetrics and Gynecology

## 2012-08-28 ENCOUNTER — Other Ambulatory Visit: Payer: Self-pay | Admitting: Obstetrics and Gynecology

## 2012-08-28 DIAGNOSIS — N6489 Other specified disorders of breast: Secondary | ICD-10-CM

## 2012-08-28 NOTE — Telephone Encounter (Signed)
TC to pt confirmed Mammogram was done at [facility] Pt has Mammogram scheduled for 09/2012  [facility] CMA

## 2012-10-07 ENCOUNTER — Ambulatory Visit
Admission: RE | Admit: 2012-10-07 | Discharge: 2012-10-07 | Disposition: A | Discharge status: Home / Self Care | Payer: Medicare Other | Source: Ambulatory Visit | Attending: Obstetrics and Gynecology | Admitting: Obstetrics and Gynecology

## 2012-10-07 DIAGNOSIS — N6489 Other specified disorders of breast: Secondary | ICD-10-CM

## 2012-10-07 DIAGNOSIS — R922 Inconclusive mammogram: Secondary | ICD-10-CM

## 2013-04-23 ENCOUNTER — Other Ambulatory Visit: Payer: Self-pay | Admitting: Family Medicine

## 2013-04-23 DIAGNOSIS — M542 Cervicalgia: Secondary | ICD-10-CM

## 2013-04-26 ENCOUNTER — Encounter (HOSPITAL_COMMUNITY): Payer: Self-pay | Admitting: Emergency Medicine

## 2013-04-26 ENCOUNTER — Emergency Department (HOSPITAL_COMMUNITY)
Admission: EM | Admit: 2013-04-26 | Discharge: 2013-04-27 | Disposition: A | Discharge status: Home / Self Care | Payer: Medicare Other | Attending: Emergency Medicine | Admitting: Emergency Medicine

## 2013-04-26 DIAGNOSIS — E119 Type 2 diabetes mellitus without complications: Secondary | ICD-10-CM | POA: Insufficient documentation

## 2013-04-26 DIAGNOSIS — M549 Dorsalgia, unspecified: Secondary | ICD-10-CM

## 2013-04-26 DIAGNOSIS — M545 Low back pain, unspecified: Secondary | ICD-10-CM | POA: Insufficient documentation

## 2013-04-26 DIAGNOSIS — IMO0002 Reserved for concepts with insufficient information to code with codable children: Secondary | ICD-10-CM | POA: Insufficient documentation

## 2013-04-26 DIAGNOSIS — E876 Hypokalemia: Secondary | ICD-10-CM | POA: Insufficient documentation

## 2013-04-26 DIAGNOSIS — M129 Arthropathy, unspecified: Secondary | ICD-10-CM | POA: Insufficient documentation

## 2013-04-26 DIAGNOSIS — M542 Cervicalgia: Secondary | ICD-10-CM | POA: Insufficient documentation

## 2013-04-26 DIAGNOSIS — E039 Hypothyroidism, unspecified: Secondary | ICD-10-CM | POA: Insufficient documentation

## 2013-04-26 HISTORY — DX: Reserved for concepts with insufficient information to code with codable children: IMO0002

## 2013-04-26 HISTORY — DX: Unspecified osteoarthritis, unspecified site: M19.90

## 2013-04-26 HISTORY — DX: Hypokalemia: E87.6

## 2013-04-26 HISTORY — DX: Disorder of thyroid, unspecified: E07.9

## 2013-04-26 MED ORDER — HYDROMORPHONE HCL PF 1 MG/ML IJ SOLN
1.0000 mg | Freq: Once | INTRAMUSCULAR | Status: AC
  Administered 2013-04-27: 1 mg via INTRAVENOUS
  Filled 2013-04-26: qty 1

## 2013-04-26 MED ORDER — SODIUM CHLORIDE 0.9 % IV BOLUS (SEPSIS)
1000.0000 mL | Freq: Once | INTRAVENOUS | Status: AC
  Administered 2013-04-26: 1000 mL via INTRAVENOUS

## 2013-04-26 MED ORDER — DIAZEPAM 5 MG PO TABS
5.0000 mg | ORAL_TABLET | Freq: Once | ORAL | Status: AC
  Administered 2013-04-27: 5 mg via ORAL
  Filled 2013-04-26: qty 1

## 2013-04-26 MED ORDER — ONDANSETRON HCL 4 MG/2ML IJ SOLN
4.0000 mg | Freq: Once | INTRAMUSCULAR | Status: AC
  Administered 2013-04-27: 4 mg via INTRAVENOUS
  Filled 2013-04-26: qty 2

## 2013-04-26 NOTE — ED Provider Notes (Signed)
CSN: [id number]     Arrival date & time 04/26/13  2239 History   First MD Initiated Contact with Patient 04/26/13 2327     Chief Complaint  Patient presents with  . Back Pain   (Consider location/radiation/quality/duration/timing/severity/associated sxs/prior Treatment) HPI Hx per patient - R sided LBP x 2 days, sharp and severe and worse with movement/ walking.  PT has h/o LBP about 5 years ago and required admit at that time.    Currently she has been dealing with R sided neck pain, had MRI a year ago and was told that she has bulging discs.  She is currently taking xanax and hydrocodone per her PCP at [facility] physicians.  She is being scheduled for another MRI of her neck.  Tonight her pain in her back feels severe despite medications at home. She has some numbness to her R thigh but no weakness. No incontinence of bowel/ bladder, no perineal paraesthesias. No F/C. No trauma. She is able to walk at home. Last medications were about 3 hours PTA.     Past Medical History  Diagnosis Date  . Disc herniation   . Arthritis     C5 & C6  . Diabetes mellitus without complication     Borderline on metformin, DC's for the last year  . Thyroid disease     Hypothyroid  . Hypokalemia    Past Surgical History  Procedure Laterality Date  . Cesarean section      x3  . Knee arthroscopy     No family history on file. History  Substance Use Topics  . Smoking status: Never Smoker   . Smokeless tobacco: Never Used  . Alcohol Use: Yes     Comment: occasionally   OB History   Grav Para Term Preterm Abortions TAB SAB Ect Mult Living                 Review of Systems  Constitutional: Negative for fever and chills.  Eyes: Negative for pain.  Respiratory: Negative for shortness of breath.   Cardiovascular: Negative for chest pain.  Gastrointestinal: Negative for abdominal pain.  Genitourinary: Negative for dysuria and flank pain.  Musculoskeletal: Positive for back pain and neck pain. Negative  for neck stiffness.  Skin: Negative for rash.  Neurological: Negative for headaches.  All other systems reviewed and are negative.    Allergies  Zolpidem and Zyprexa  Home Medications   Current Outpatient Rx  Name  Route  Sig  Dispense  Refill  . ALPRAZolam (XANAX) 1 MG tablet   Oral   Take 1-2 mg by mouth 3 (three) times daily. 1mg  in the morning, 1mg  in the evening, and 2mg  at night         . b complex vitamins tablet   Oral   Take 1 tablet by mouth every morning.         . cholecalciferol (VITAMIN D) 1000 UNITS tablet   Oral   Take 1,000 Units by mouth every morning.         Marland Kitchen HYDROcodone-acetaminophen (NORCO/VICODIN) 5-325 MG per tablet   Oral   Take 1 tablet by mouth every 6 (six) hours as needed for moderate pain.         Marland Kitchen levothyroxine (SYNTHROID, LEVOTHROID) 88 MCG tablet   Oral   Take 88 mcg by mouth daily before breakfast.         . Multiple Vitamin (MULTIVITAMIN WITH MINERALS) TABS tablet   Oral   Take 1 tablet  by mouth every morning.         Marland Kitchen OVER THE COUNTER MEDICATION   Oral   Take 1 capsule by mouth every morning. [place]ail         . OVER THE COUNTER MEDICATION   Oral   Take 1 capsule by mouth every morning. Mushroom Complete         . potassium chloride SA (K-DUR,KLOR-CON) 20 MEQ tablet   Oral   Take 20 mEq by mouth every morning.         . torsemide (DEMADEX) 10 MG tablet   Oral   Take 10 mg by mouth every morning.         . traZODone (DESYREL) 50 MG tablet   Oral   Take 100 mg by mouth at bedtime.         . vitamin C (ASCORBIC ACID) 500 MG tablet   Oral   Take 500 mg by mouth every morning.         . vitamin E 400 UNIT capsule   Oral   Take 400 Units by mouth every morning.          BP 131/75  Pulse 76  Temp(Src) 98.2 F (36.8 C) (Oral)  Resp 19  Ht 5\' 3"  (1.6 m)  Wt 179 lb (81.194 kg)  BMI 31.72 kg/m2  SpO2 97% Physical Exam  Constitutional: She is oriented to person, place, and time. She  appears well-developed and well-nourished.  HENT:  Head: Normocephalic and atraumatic.  Eyes: EOM are normal. Pupils are equal, round, and reactive to light.  Neck: No tracheal deviation present.  No C spine deformity or tenderness  Cardiovascular: Regular rhythm and intact distal pulses.   Pulmonary/Chest: Effort normal and breath sounds normal. No stridor. No respiratory distress.  Abdominal: Soft. Bowel sounds are normal. She exhibits no distension. There is no tenderness.  Musculoskeletal: Normal range of motion. She exhibits no edema.  TTP lower lumbar and paralumbar without deformity. No LE deficits with equal intact DTRs, strengths and sensorium to light touch.   Neurological: She is alert and oriented to person, place, and time.  Skin: Skin is warm and dry.    ED Course  Procedures (including critical care time) Labs Review Labs Reviewed  CBC - Abnormal; Notable for the following:    HCT 35.4 (*)    All other components within normal limits  BASIC METABOLIC PANEL - Abnormal; Notable for the following:    GFR calc non Af Amer 57 (*)    GFR calc Af Amer 65 (*)    All other components within normal limits   Imaging Review No results found.  1:10 AM pain improved in her neck but still severe in her back.  PT unable to ambulate, is requesting MRI of her neck and back - states she has one scheduled with her PCP but prefers to have it done today.   MR ordered, continued Iv Dilaudid pain control.  7a, - MR pending.   MDM  Dx: neck pain and back pain  IV narcotics pain control Labs and Imaging ordered to further evaluate    Sunnie Nielsen, MD 04/27/13 2304

## 2013-04-26 NOTE — ED Notes (Signed)
Bed: ZO10 Expected date: 04/26/13 Expected time: 10:36 PM Means of arrival: Ambulance Comments: Bed 5, EMS, 62 F, Back Pain

## 2013-04-26 NOTE — ED Notes (Signed)
MD at bedside. 

## 2013-04-26 NOTE — ED Notes (Signed)
Per EMS, pt has had neck pain x 6 weeks. Two days ago the pt bent over and felt something give in her rt lower back. Pt states that her current pain meds are not adequate for pain control. Pt supposed to have a MRI in the next week for her neck pain. Pt has hx of arthritis in the back and buldging discs.

## 2013-04-27 ENCOUNTER — Encounter: Payer: Self-pay | Admitting: Vascular Surgery

## 2013-04-27 ENCOUNTER — Emergency Department (HOSPITAL_COMMUNITY): Payer: Medicare Other

## 2013-04-27 LAB — CBC
Hemoglobin: 12.5 g/dL (ref 12.0–15.0)
MCH: 30.6 pg (ref 26.0–34.0)
MCHC: 35.3 g/dL (ref 30.0–36.0)
RDW: 13.9 % (ref 11.5–15.5)

## 2013-04-27 LAB — BASIC METABOLIC PANEL
BUN: 17 mg/dL (ref 6–23)
Calcium: 8.6 mg/dL (ref 8.4–10.5)
GFR calc Af Amer: 65 mL/min — ABNORMAL LOW (ref >90)
GFR calc non Af Amer: 57 mL/min — ABNORMAL LOW (ref >90)
Glucose, Bld: 95 mg/dL (ref 70–99)
Sodium: 139 mEq/L (ref 135–145)

## 2013-04-27 MED ORDER — HYDROMORPHONE HCL PF 1 MG/ML IJ SOLN
1.0000 mg | Freq: Once | INTRAMUSCULAR | Status: AC
  Administered 2013-04-27: 1 mg via INTRAVENOUS
  Filled 2013-04-27: qty 1

## 2013-04-27 MED ORDER — ONDANSETRON HCL 4 MG/2ML IJ SOLN
4.0000 mg | Freq: Once | INTRAMUSCULAR | Status: AC
  Administered 2013-04-27: 4 mg via INTRAVENOUS
  Filled 2013-04-27: qty 2

## 2013-04-27 MED ORDER — ONDANSETRON HCL 4 MG/2ML IJ SOLN: 4.0000 mg | Freq: Once | INTRAMUSCULAR | Status: DC

## 2013-04-27 MED ORDER — OXYCODONE-ACETAMINOPHEN 5-325 MG PO TABS: 1.0000 | ORAL_TABLET | ORAL | Status: DC | PRN

## 2013-04-27 NOTE — ED Notes (Signed)
MD at bedside. 

## 2013-04-27 NOTE — ED Notes (Signed)
[facility] number [phone]

## 2013-04-27 NOTE — ED Notes (Signed)
Patient transported to MRI 

## 2013-04-27 NOTE — ED Notes (Signed)
EDP Opitz to see pt ambulate in hall. Pt ambulated independent with encouragment

## 2013-04-27 NOTE — ED Provider Notes (Signed)
7:01 AM Assumed care of patient at change of shift from Dr Dierdre Highman.  Pt with chronic neck pain, now with back pain, difficulty walking due to pain.  Pt lives alone.  [facility] P[facility] follow up.  Plan for MRI C, T, L spine.  Improved with valium.  Plan d/c home with percocet if MRIs normal.   9:16 AM Discussed MRI results with patient and family.  Pt requests f/u with Dr Lovell Sheehan (neurosurgery), has seen Dr Phoebe Perch in the past.  I have also recommended close PCP follow up (is seeing PA Delena Serve at [facility] while PCP is on maternity leave).  D/C home with percocet after redose of pain medication and proven ambulation.  Discussed result, findings, treatment, and follow up  with patient.  Pt given return precautions.  Pt verbalizes understanding and agrees with plan.      [place], PA-C 04/27/13 [phone]

## 2013-04-27 NOTE — ED Notes (Signed)
Patient asking for more pain medication--will make Dr. Dierdre Highman aware Report given to [place], RN  End of assignment

## 2013-04-27 NOTE — ED Notes (Signed)
Gave patient a ginger ale 

## 2013-04-27 NOTE — ED Notes (Signed)
Family at bedside. 

## 2013-04-27 NOTE — ED Notes (Signed)
Patient informed of order to ambulate Patient states that she is able to walk, but does "not have much stamina" Patient and pt's daughter want to speak with Dr. Dierdre Highman about an MRI--MD at the bedside to speak with patient

## 2013-04-27 NOTE — ED Notes (Signed)
Pt back from MRI 

## 2013-04-27 NOTE — ED Provider Notes (Signed)
Medical screening examination/treatment/procedure(s) were conducted as a shared visit with non-physician practitioner(s) and myself.  I personally evaluated the patient during the encounter.   After multiple rounds of IV narcotics able to ambulate no neuro deficits/ gait intact.   Sunnie Nielsen, MD 04/27/13 [phone]

## 2014-04-19 ENCOUNTER — Encounter: Payer: Self-pay | Admitting: Vascular Surgery

## 2016-08-06 ENCOUNTER — Ambulatory Visit (INDEPENDENT_AMBULATORY_CARE_PROVIDER_SITE_OTHER): Payer: 59 | Admitting: Psychology

## 2016-08-06 DIAGNOSIS — F331 Major depressive disorder, recurrent, moderate: Secondary | ICD-10-CM | POA: Diagnosis not present

## 2016-08-08 ENCOUNTER — Emergency Department (HOSPITAL_COMMUNITY)
Admission: EM | Admit: 2016-08-08 | Discharge: 2016-08-08 | Disposition: A | Discharge status: Home / Self Care | Payer: Medicare Other | Attending: Emergency Medicine | Admitting: Emergency Medicine

## 2016-08-08 ENCOUNTER — Emergency Department (HOSPITAL_COMMUNITY): Payer: Medicare Other

## 2016-08-08 ENCOUNTER — Encounter (HOSPITAL_COMMUNITY): Payer: Self-pay | Admitting: Emergency Medicine

## 2016-08-08 DIAGNOSIS — Z7984 Long term (current) use of oral hypoglycemic drugs: Secondary | ICD-10-CM | POA: Diagnosis not present

## 2016-08-08 DIAGNOSIS — E039 Hypothyroidism, unspecified: Secondary | ICD-10-CM | POA: Insufficient documentation

## 2016-08-08 DIAGNOSIS — R51 Headache: Secondary | ICD-10-CM | POA: Diagnosis present

## 2016-08-08 DIAGNOSIS — E119 Type 2 diabetes mellitus without complications: Secondary | ICD-10-CM | POA: Insufficient documentation

## 2016-08-08 DIAGNOSIS — R519 Headache, unspecified: Secondary | ICD-10-CM

## 2016-08-08 MED ORDER — DIPHENHYDRAMINE HCL 50 MG/ML IJ SOLN
25.0000 mg | Freq: Once | INTRAMUSCULAR | Status: AC
  Administered 2016-08-08: 25 mg via INTRAVENOUS
  Filled 2016-08-08: qty 1

## 2016-08-08 MED ORDER — KETOROLAC TROMETHAMINE 15 MG/ML IJ SOLN
15.0000 mg | Freq: Once | INTRAMUSCULAR | Status: AC
  Administered 2016-08-08: 15 mg via INTRAVENOUS
  Filled 2016-08-08: qty 1

## 2016-08-08 MED ORDER — SODIUM CHLORIDE 0.9 % IV BOLUS (SEPSIS)
1000.0000 mL | Freq: Once | INTRAVENOUS | Status: AC
  Administered 2016-08-08: 1000 mL via INTRAVENOUS

## 2016-08-08 MED ORDER — PROCHLORPERAZINE EDISYLATE 5 MG/ML IJ SOLN
10.0000 mg | Freq: Once | INTRAMUSCULAR | Status: AC
  Administered 2016-08-08: 10 mg via INTRAVENOUS
  Filled 2016-08-08: qty 2

## 2016-08-08 NOTE — ED Notes (Signed)
Pt states her legs feel tight after med administration. MD made aware.

## 2016-08-08 NOTE — ED Triage Notes (Signed)
Per EMS, they were called out for flu-like symptoms. Pt states she has had pneumonia for two weeks and has been taking a z-pack/predinisone for that. Pt was supposed to have a checkup today but began vomiting and having a headache at 2 am this morning.  Pt denies SHOB or chest pain.  States she always has pain all over d/t fibromyalgia.  Pt c/o nausea. A&O x 4.

## 2016-08-08 NOTE — ED Notes (Signed)
Bed: WA06 Expected date:  Expected time:  Means of arrival:  Comments: 61 yo n/v

## 2016-08-08 NOTE — ED Provider Notes (Signed)
[facility]-EMERGENCY DEPT Provider Note    By signing my name below, I, Alison Cline, attest that this documentation has been prepared under the direction and in the presence of [name], MD. Electronically Signed: Earmon Cline, ED Scribe. 08/08/16. 1:53 PM.    History   Chief Complaint Chief Complaint  Patient presents with  . Flu Like Symptoms    The history is provided by the patient and medical records. No language interpreter was used.    Alison Cline is a 61 y.o. female brought in by EMS with PMHx of DM, hypothyroidism and bipolar disorder who presents to the Emergency Department complaining of severe HA (describes the pain as sharp and states it is located in the right temporal region and woke her from her sleep at onset) that began yesterday. She states this HA is different than any other HA she has ever had. She reports associated nausea and vomiting that began yesterday as well. Pt reports having flu like symptoms three weeks ago and was seen by her PCP last week and was diagnosed with pneumonia. She states she had an appt with her PCP for a follow up today but states she did not feel like she could make it there so she came here. She has taken a Z-Pak for the pneumonia as well as Prednisone which she reports having one more dose. She has not taken anything for pain relief. She denies modifying factors. She denies fever, chills, numbness, tingling or weakness of any extremity, visual changes, abdominal pain, dizziness or light headedness.   Past Medical History:  Diagnosis Date  . Arthritis    C5 & C6  . Bipolar disorder (HCC)   . Diabetes mellitus without complication (HCC)   . Diabetes mellitus without complication (HCC)    Borderline on metformin, DC's for the last year  . Disc herniation   . DVT (deep venous thrombosis) (HCC)   . Hypokalemia   . Thyroid disease    Hypothyroid    Patient Active Problem List   Diagnosis Date Noted  . Varicose veins of  lower extremities with other complications 05/29/2012  . Condylomata acuminata 03/03/2012    Past Surgical History:  Procedure Laterality Date  . CESAREAN SECTION     X3  . CESAREAN SECTION     x3  . KNEE ARTHROSCOPY    . KNEE SURGERY      OB History    Gravida Para Term Preterm AB Living   3 3       3    SAB TAB Ectopic Multiple Live Births                   Home Medications    Prior to Admission medications   Medication Sig Start Date End Date Taking? Authorizing Provider  ALPRAZolam Prudy Feeler) 1 MG tablet Take 1-2 mg by mouth 3 (three) times daily. 1mg  in the morning, 1mg  in the evening, and 2mg  at night    Historical Provider, MD  alprazolam Prudy Feeler) 2 MG tablet Take 2 mg by mouth at bedtime as needed. Pt takes 1/2 am, 1/2 lunch, 1 whole tablet at bedtime    Historical Provider, MD  amphetamine-dextroamphetamine (ADDERALL XR) 20 MG 24 hr capsule Take 20 mg by mouth daily as needed.    Historical Provider, MD  b complex vitamins tablet Take 1 tablet by mouth every morning.    Historical Provider, MD  Calcium Carb-Cholecalciferol (CALCIUM-VITAMIN D) 600-400 MG-UNIT TABS Take by mouth.    Historical  Provider, MD  calcium carbonate (OS-CAL) 600 MG TABS Take 600 mg by mouth 2 (two) times daily with a meal.    Historical Provider, MD  cholecalciferol (VITAMIN D) 1000 UNITS tablet Take 1,000 Units by mouth every morning.    Historical Provider, MD  diphenhydrAMINE (BENADRYL) 50 MG capsule Take 2 capsules every 6 hours for the next 3 days then as needed for tongue swelling. 03/05/12   [name], MD  famotidine (PEPCID) 20 MG tablet Take 1 tablet twice daily for the next 3 days then as needed for tongue swelling. 03/05/12   [name], MD  HYDROcodone-acetaminophen (NORCO/VICODIN) 5-325 MG per tablet Take 1 tablet by mouth every 6 (six) hours as needed for moderate pain.    Historical Provider, MD  lamoTRIgine (LAMICTAL) 100 MG tablet Take 100 mg by mouth 2 (two) times daily. Anti-seizure  medication.  Consult needed.    Historical Provider, MD  levothyroxine (SYNTHROID, LEVOTHROID) 75 MCG tablet Take 75 mcg by mouth daily.    Historical Provider, MD  levothyroxine (SYNTHROID, LEVOTHROID) 88 MCG tablet Take 88 mcg by mouth daily before breakfast.    Historical Provider, MD  metFORMIN (GLUCOPHAGE) 500 MG tablet Take 500 mg by mouth 2 (two) times daily with a meal.    Historical Provider, MD  Multiple Vitamin (MULTIVITAMIN WITH MINERALS) TABS tablet Take 1 tablet by mouth every morning.    Historical Provider, MD  Multiple Vitamins-Minerals (CENTRUM SILVER ULTRA WOMENS PO) Take by mouth.    Historical Provider, MD  OVER THE COUNTER MEDICATION Pt takes Mushroom Complete 8 bid    Historical Provider, MD  OVER THE COUNTER MEDICATION Take 1 capsule by mouth every morning. [place] Tail    Historical Provider, MD  OVER THE COUNTER MEDICATION Take 1 capsule by mouth every morning. Mushroom Complete    Historical Provider, MD  oxyCODONE-acetaminophen (PERCOCET/ROXICET) 5-325 MG per tablet Take 1-2 tablets by mouth every 4 (four) hours as needed for severe pain. 04/27/13   [name], PA-C  potassium chloride (KLOR-CON) 20 MEQ packet Take 20 mEq by mouth daily.    Historical Provider, MD  potassium chloride SA (K-DUR,KLOR-CON) 20 MEQ tablet Take 20 mEq by mouth every morning.    Historical Provider, MD  predniSONE (DELTASONE) 50 MG tablet Take tablet on the afternoon of Wednesday, September 18. 03/05/12   [name], MD  rOPINIRole (REQUIP) 0.25 MG tablet Take 0.25 mg by mouth 2 (two) times daily.    Historical Provider, MD  simvastatin (ZOCOR) 10 MG tablet Take 10 mg by mouth at bedtime.    Historical Provider, MD  temazepam (RESTORIL) 30 MG capsule Take 30 mg by mouth at bedtime as needed.    Historical Provider, MD  torsemide (DEMADEX) 10 MG tablet Take 10 mg by mouth every morning.    Historical Provider, MD  torsemide (DEMADEX) 20 MG tablet Take 20 mg by mouth daily.    Historical Provider,  MD  traZODone (DESYREL) 50 MG tablet Take 50 mg by mouth at bedtime.    Historical Provider, MD  traZODone (DESYREL) 50 MG tablet Take 100 mg by mouth at bedtime.    Historical Provider, MD  vitamin C (ASCORBIC ACID) 500 MG tablet Take 500 mg by mouth every morning.    Historical Provider, MD  vitamin E 400 UNIT capsule Take 400 Units by mouth every morning.    Historical Provider, MD    Family History Family History  Problem Relation Age of Onset  . Cancer Mother  lukemia  . Hypertension Mother   . Other Mother     varicose veins  . Cancer Father     prostate  . Cancer Sister     colon  . Cancer Brother     renal  . Heart attack Brother     X2  . Heart disease Brother   . Hypertension Brother   . Heart attack Brother   . Heart disease Brother     Social History Social History  Substance Use Topics  . Smoking status: Never Smoker  . Smokeless tobacco: Never Used  . Alcohol use Yes     Comment: stopped using in 12/2011     Allergies   Ambien [zolpidem]; Zolpidem; Zyprexa [olanzapine]; and Zyprexa [olanzapine]   Review of Systems Review of Systems A complete 10 system review of systems was obtained and all systems are negative except as noted in the HPI and PMH.    Physical Exam Updated Vital Signs Ht 5\' 3"  (1.6 m)   Wt 200 lb (90.7 kg)   BMI 35.43 kg/m   Physical Exam  Constitutional: She is oriented to person, place, and time. She appears well-developed and well-nourished. No distress.  Appears tired  HENT:  Head: Normocephalic and atraumatic.  Eyes: EOM are normal.  Neck: Normal range of motion.  No nuchal rigidity  Cardiovascular: Normal rate, regular rhythm and normal heart sounds.   Pulmonary/Chest: Effort normal and breath sounds normal.  Abdominal: Soft. She exhibits no distension. There is no tenderness.  Musculoskeletal: Normal range of motion.  Neurological: She is alert and oriented to person, place, and time. She displays normal  reflexes. No cranial nerve deficit or sensory deficit. She exhibits normal muscle tone. Coordination normal.  Skin: Skin is warm and dry.  Psychiatric: She has a normal mood and affect. Judgment normal.  Nursing note and vitals reviewed.    ED Treatments / Results  DIAGNOSTIC STUDIES:  COORDINATION OF CARE: 1:49 PM- Will order CXR. Pt verbalizes understanding and agrees to plan.  Medications - No data to display  Labs (all labs ordered are listed, but only abnormal results are displayed) Labs Reviewed - No data to display  EKG  EKG Interpretation None       Radiology No results found.  Procedures Procedures (including critical care time)  Medications Ordered in ED Medications - No data to display   Initial Impression / Assessment and Plan / ED Course  I have reviewed the triage vital signs and the nursing notes.  Pertinent labs & imaging results that were available during my care of the patient were reviewed by me and considered in my medical decision making (see chart for details).    I personally preformed the services scribed in my presence. The recorded information has been reviewed is accurate. [name], MD.    Final Clinical Impressions(s) / ED Diagnoses   Final diagnoses:  Nonintractable headache, unspecified chronicity pattern, unspecified headache type    New Prescriptions New Prescriptions   No medications on file     [name], MD 08/26/16 1829

## 2016-08-23 ENCOUNTER — Ambulatory Visit: Payer: 59 | Admitting: Psychology

## 2018-09-03 IMAGING — CR DG CHEST 2V
2 series · 2 of 2 positions shown · non-contrast
Comparison: None.

CLINICAL DATA: Headache with vomiting

EXAM:
CHEST  2 VIEW

[w chest lat]
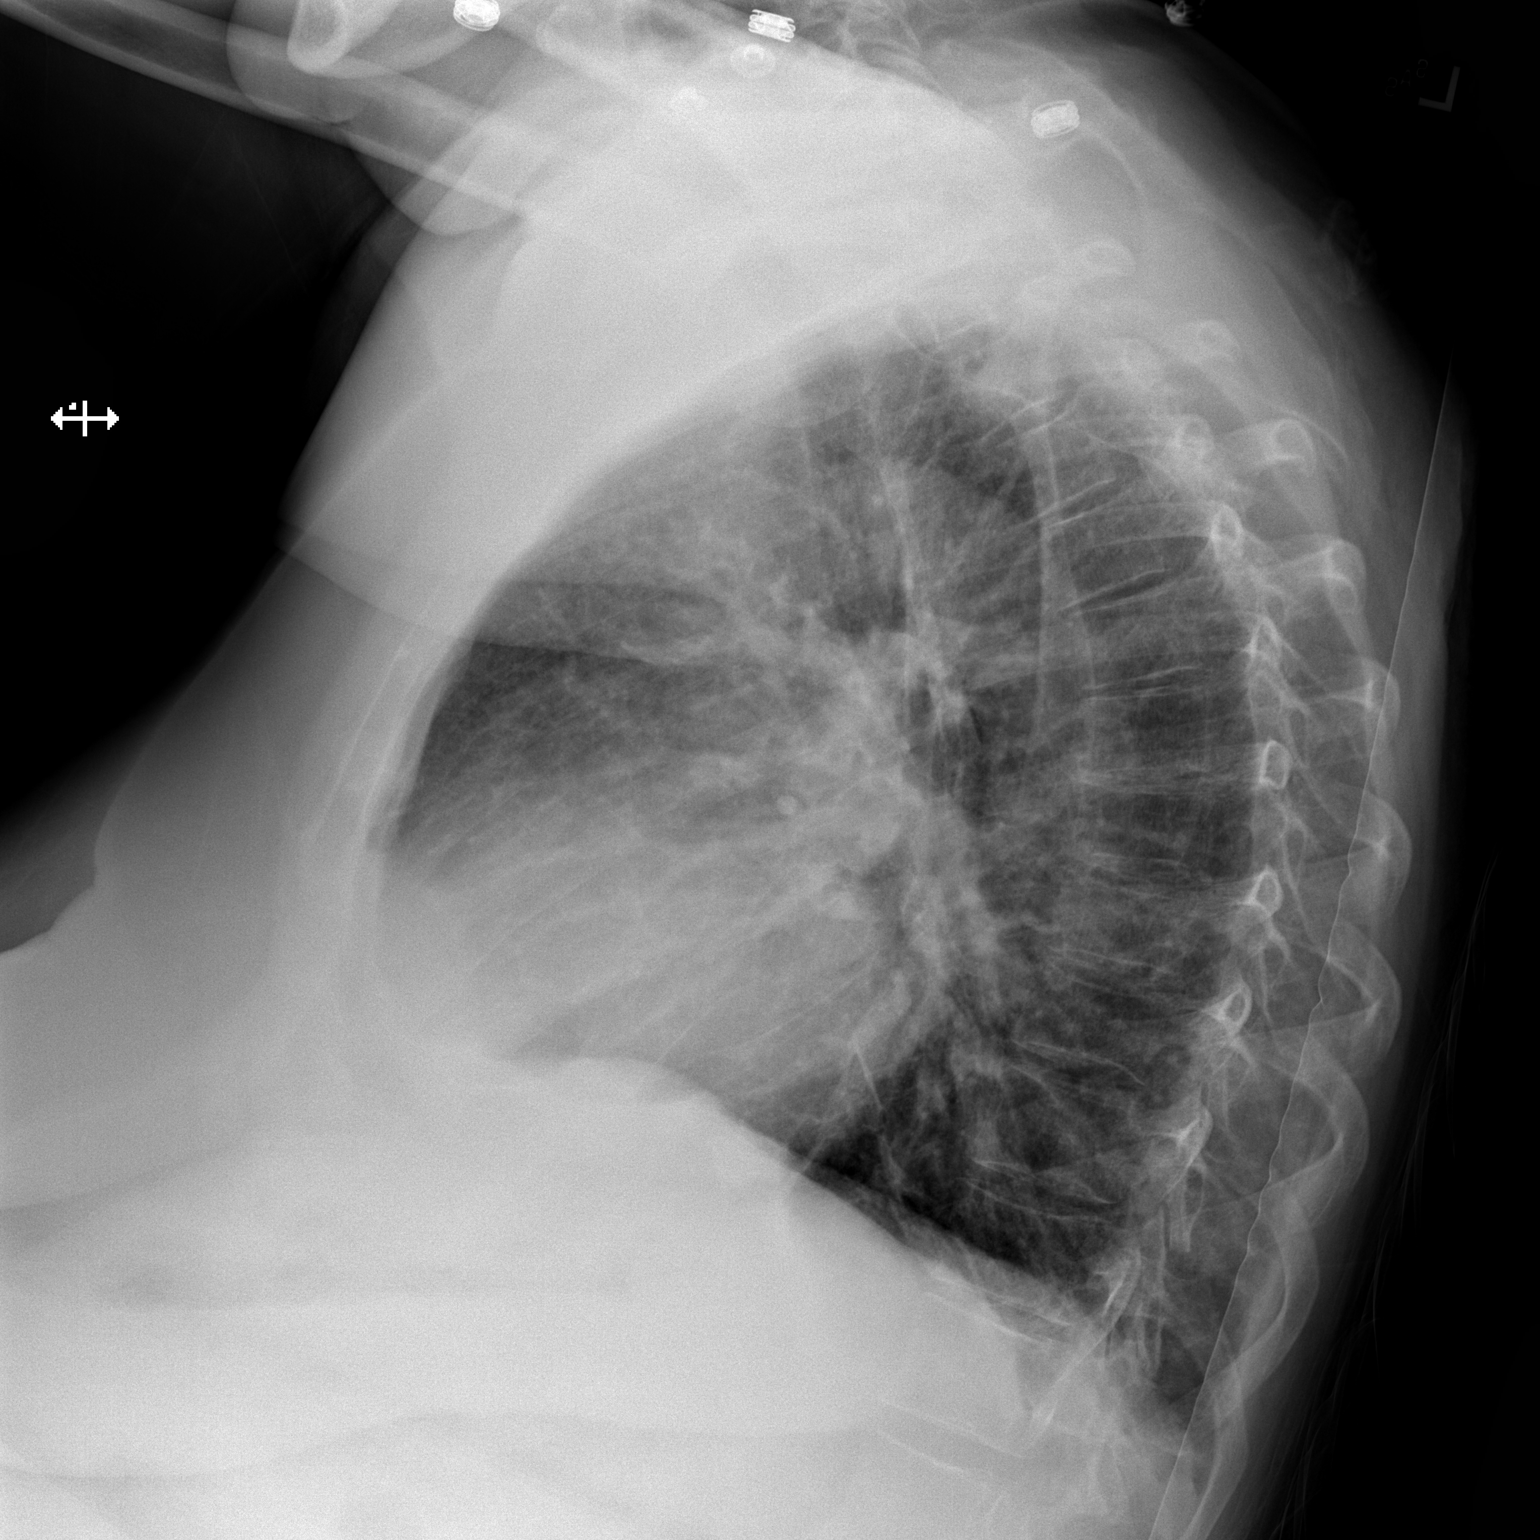

[x chest ap]
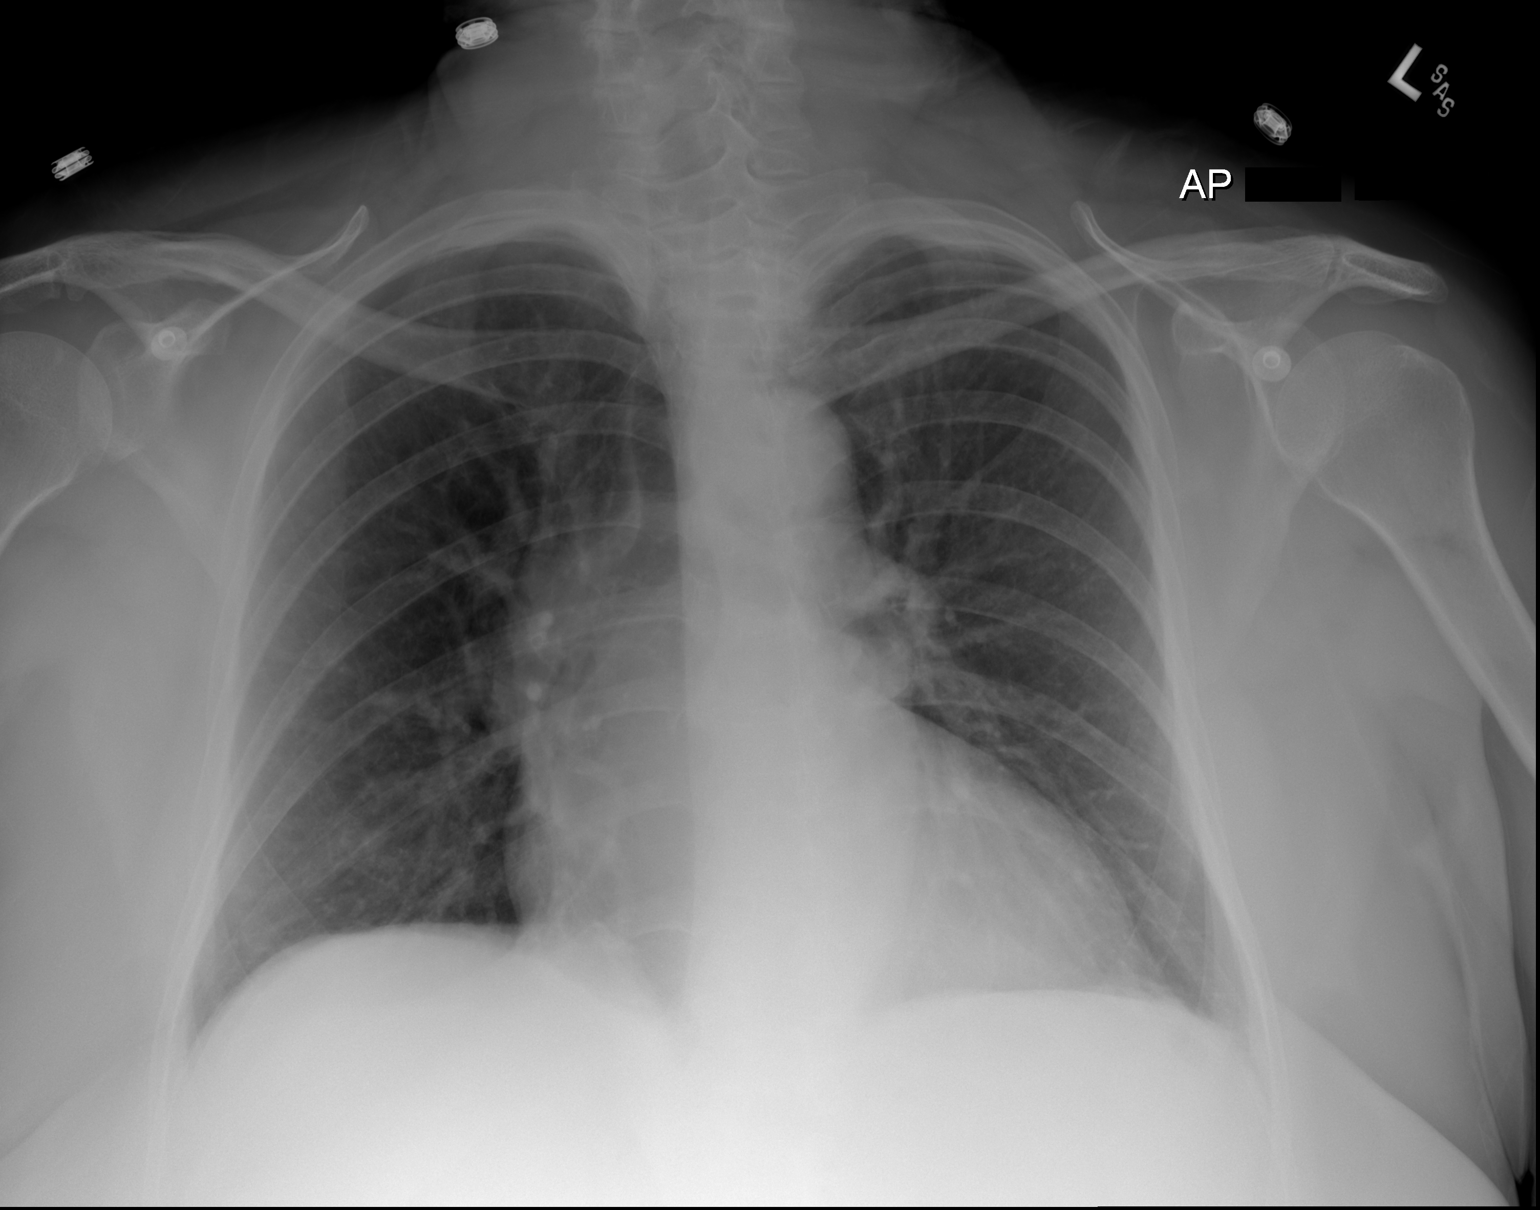

[2 of 2 positions shown; findings below may reference images not displayed]

FINDINGS: Lungs are clear. Heart is slightly enlarged with pulmonary
vascularity within normal limits. No adenopathy. No bone lesions.
IMPRESSION: Mild cardiac enlargement.  No edema or consolidation.

## 2020-02-19 DIAGNOSIS — Z0000 Encounter for general adult medical examination without abnormal findings: Secondary | ICD-10-CM | POA: Insufficient documentation

## 2020-02-23 DIAGNOSIS — F322 Major depressive disorder, single episode, severe without psychotic features: Secondary | ICD-10-CM | POA: Insufficient documentation

## 2020-02-23 DIAGNOSIS — F319 Bipolar disorder, unspecified: Secondary | ICD-10-CM | POA: Insufficient documentation

## 2020-02-23 DIAGNOSIS — E119 Type 2 diabetes mellitus without complications: Secondary | ICD-10-CM | POA: Insufficient documentation

## 2020-02-23 DIAGNOSIS — I10 Essential (primary) hypertension: Secondary | ICD-10-CM | POA: Insufficient documentation

## 2020-02-23 DIAGNOSIS — M47816 Spondylosis without myelopathy or radiculopathy, lumbar region: Secondary | ICD-10-CM | POA: Insufficient documentation

## 2020-02-23 DIAGNOSIS — E039 Hypothyroidism, unspecified: Secondary | ICD-10-CM | POA: Insufficient documentation

## 2020-02-23 DIAGNOSIS — I82409 Acute embolism and thrombosis of unspecified deep veins of unspecified lower extremity: Secondary | ICD-10-CM | POA: Insufficient documentation

## 2020-02-24 DIAGNOSIS — N183 Chronic kidney disease, stage 3 unspecified: Secondary | ICD-10-CM | POA: Insufficient documentation

## 2020-03-17 DIAGNOSIS — E785 Hyperlipidemia, unspecified: Secondary | ICD-10-CM | POA: Insufficient documentation

## 2020-03-17 DIAGNOSIS — M109 Gout, unspecified: Secondary | ICD-10-CM | POA: Insufficient documentation

## 2020-03-17 DIAGNOSIS — E876 Hypokalemia: Secondary | ICD-10-CM | POA: Insufficient documentation

## 2021-02-07 ENCOUNTER — Other Ambulatory Visit: Payer: Self-pay | Admitting: Internal Medicine

## 2021-02-07 DIAGNOSIS — Z13820 Encounter for screening for osteoporosis: Secondary | ICD-10-CM

## 2021-02-07 DIAGNOSIS — E210 Primary hyperparathyroidism: Secondary | ICD-10-CM

## 2021-02-09 ENCOUNTER — Other Ambulatory Visit: Payer: Self-pay | Admitting: Internal Medicine

## 2021-02-10 ENCOUNTER — Other Ambulatory Visit: Payer: Self-pay | Admitting: Internal Medicine

## 2021-02-10 DIAGNOSIS — E210 Primary hyperparathyroidism: Secondary | ICD-10-CM

## 2021-02-10 DIAGNOSIS — Z13820 Encounter for screening for osteoporosis: Secondary | ICD-10-CM

## 2021-02-13 ENCOUNTER — Other Ambulatory Visit: Payer: Self-pay | Admitting: Internal Medicine

## 2021-02-13 ENCOUNTER — Other Ambulatory Visit: Payer: Self-pay | Admitting: Family Medicine

## 2021-02-13 ENCOUNTER — Other Ambulatory Visit: Payer: Self-pay

## 2021-02-13 DIAGNOSIS — E213 Hyperparathyroidism, unspecified: Secondary | ICD-10-CM

## 2021-02-13 DIAGNOSIS — Z13820 Encounter for screening for osteoporosis: Secondary | ICD-10-CM

## 2021-08-01 ENCOUNTER — Ambulatory Visit
Admission: RE | Admit: 2021-08-01 | Discharge: 2021-08-01 | Disposition: A | Discharge status: Home / Self Care | Payer: Medicare Other | Source: Ambulatory Visit | Attending: Internal Medicine | Admitting: Internal Medicine

## 2021-08-01 DIAGNOSIS — E213 Hyperparathyroidism, unspecified: Secondary | ICD-10-CM

## 2021-08-01 DIAGNOSIS — Z13820 Encounter for screening for osteoporosis: Secondary | ICD-10-CM

## 2021-08-09 ENCOUNTER — Other Ambulatory Visit: Payer: Self-pay | Admitting: Family Medicine

## 2021-08-09 ENCOUNTER — Ambulatory Visit
Admission: RE | Admit: 2021-08-09 | Discharge: 2021-08-09 | Disposition: A | Discharge status: Home / Self Care | Payer: Medicare Other | Source: Ambulatory Visit | Attending: Family Medicine | Admitting: Family Medicine

## 2021-08-09 DIAGNOSIS — R053 Chronic cough: Secondary | ICD-10-CM

## 2022-03-08 LAB — HM MAMMOGRAPHY

## 2022-08-30 ENCOUNTER — Ambulatory Visit (INDEPENDENT_AMBULATORY_CARE_PROVIDER_SITE_OTHER): Payer: Medicare Other | Admitting: Physician Assistant

## 2022-08-30 ENCOUNTER — Encounter: Payer: Self-pay | Admitting: Physician Assistant

## 2022-08-30 ENCOUNTER — Ambulatory Visit (INDEPENDENT_AMBULATORY_CARE_PROVIDER_SITE_OTHER)
Admission: RE | Admit: 2022-08-30 | Discharge: 2022-08-30 | Disposition: A | Discharge status: Home / Self Care | Payer: Medicare Other | Source: Ambulatory Visit | Attending: Physician Assistant | Admitting: Physician Assistant

## 2022-08-30 VITALS — BP 142/76 | HR 75 | Temp 98.0°F | Ht 63.0 in | Wt 195.0 lb

## 2022-08-30 DIAGNOSIS — Z23 Encounter for immunization: Secondary | ICD-10-CM

## 2022-08-30 DIAGNOSIS — Z8639 Personal history of other endocrine, nutritional and metabolic disease: Secondary | ICD-10-CM

## 2022-08-30 DIAGNOSIS — F419 Anxiety disorder, unspecified: Secondary | ICD-10-CM

## 2022-08-30 DIAGNOSIS — M25561 Pain in right knee: Secondary | ICD-10-CM

## 2022-08-30 DIAGNOSIS — I10 Essential (primary) hypertension: Secondary | ICD-10-CM

## 2022-08-30 DIAGNOSIS — E782 Mixed hyperlipidemia: Secondary | ICD-10-CM

## 2022-08-30 DIAGNOSIS — R739 Hyperglycemia, unspecified: Secondary | ICD-10-CM | POA: Diagnosis not present

## 2022-08-30 DIAGNOSIS — M797 Fibromyalgia: Secondary | ICD-10-CM

## 2022-08-30 LAB — CBC WITH DIFFERENTIAL/PLATELET
Basophils Absolute: 0 10*3/uL (ref 0.0–0.1)
Basophils Relative: 0.8 % (ref 0.0–3.0)
Eosinophils Absolute: 0.1 10*3/uL (ref 0.0–0.7)
Eosinophils Relative: 2.7 % (ref 0.0–5.0)
HCT: 40.2 % (ref 36.0–46.0)
Hemoglobin: 13.8 g/dL (ref 12.0–15.0)
Lymphocytes Relative: 28.4 % (ref 12.0–46.0)
Lymphs Abs: 1.4 10*3/uL (ref 0.7–4.0)
MCHC: 34.4 g/dL (ref 30.0–36.0)
MCV: 92.3 fl (ref 78.0–100.0)
Monocytes Absolute: 0.3 10*3/uL (ref 0.1–1.0)
Monocytes Relative: 6.1 % (ref 3.0–12.0)
Neutro Abs: 3.1 10*3/uL (ref 1.4–7.7)
Neutrophils Relative %: 62 % (ref 43.0–77.0)
Platelets: 195 10*3/uL (ref 150.0–400.0)
RBC: 4.36 Mil/uL (ref 3.87–5.11)
RDW: 14.6 % (ref 11.5–15.5)
WBC: 5 10*3/uL (ref 4.0–10.5)

## 2022-08-30 LAB — COMPREHENSIVE METABOLIC PANEL
ALT: 19 U/L (ref 0–35)
AST: 22 U/L (ref 0–37)
Albumin: 4.3 g/dL (ref 3.5–5.2)
Alkaline Phosphatase: 51 U/L (ref 39–117)
BUN: 15 mg/dL (ref 6–23)
CO2: 28 mEq/L (ref 19–32)
Calcium: 9.5 mg/dL (ref 8.4–10.5)
Chloride: 102 mEq/L (ref 96–112)
Creatinine, Ser: 0.97 mg/dL (ref 0.40–1.20)
GFR: 60.6 mL/min (ref >60.00)
Glucose, Bld: 111 mg/dL — ABNORMAL HIGH (ref 70–99)
Potassium: 3.7 mEq/L (ref 3.5–5.1)
Sodium: 140 mEq/L (ref 135–145)
Total Bilirubin: 0.4 mg/dL (ref 0.2–1.2)
Total Protein: 7.1 g/dL (ref 6.0–8.3)

## 2022-08-30 LAB — LIPID PANEL
Cholesterol: 269 mg/dL — ABNORMAL HIGH (ref 0–200)
HDL: 62.1 mg/dL (ref >39.00)
NonHDL: 207.38
Total CHOL/HDL Ratio: 4
Triglycerides: 272 mg/dL — ABNORMAL HIGH (ref 0.0–149.0)
VLDL: 54.4 mg/dL — ABNORMAL HIGH (ref 0.0–40.0)

## 2022-08-30 LAB — HEMOGLOBIN A1C: Hgb A1c MFr Bld: 5.6 % (ref 4.6–6.5)

## 2022-08-30 LAB — LDL CHOLESTEROL, DIRECT: Direct LDL: 154 mg/dL

## 2022-08-30 LAB — TSH: TSH: 3.12 u[IU]/mL (ref 0.35–5.50)

## 2022-08-30 NOTE — Assessment & Plan Note (Signed)
Doing well with Cymbalta 60 mg daily Encouraged continued work on lifestyle habits

## 2022-08-30 NOTE — Progress Notes (Signed)
Subjective:    Patient ID: Alison Cline, female    DOB: May 03, 1956, 67 y.o.   MRN: [id number]  Chief Complaint  Patient presents with   New Patient (Initial Visit)    New pt in office to establish care with PCP; needs management with hypertension, fibromyalgia, and currently c/o right knee pain. Missed a couple steps and twisted knee, took a round of prednisone and wearing brace but not helping and no change to pain. Has also seen chiropractor and no changes, tried heat and ice therapy as well. Pt needs referral for colonoscopy with family hx; never had one before    HPI 67 y.o. patient presents today for new patient establishment with me.  Patient was previously established with [facility].  Acute Concerns: Right knee pain x 6 weeks, medial pain; hurts to walk or put pressure on it; Advil with Acetaminophen three every 6 hours. Tried braces without relief. Chiropractor x 3 weeks didn't help. Missed a few steps and twisted it. Urgent care - gave prednisone without relief. No XRAY done.   BP 130/80 at home, runs higher at [place]; taking meds daily as directed  Anxiety - buspar 7.5 mg TID - doesn't feel like this is helping at all Fibromyalgia - taking Cymbalta 60 mg     08/30/2022    8:51 AM  GAD 7 : Generalized Anxiety Score  Nervous, Anxious, on Edge 2  Control/stop worrying 2  Worry too much - different things 2  Trouble relaxing 2  Restless 2  Easily annoyed or irritable 0  Afraid - awful might happen 0  Total GAD 7 Score 10  Anxiety Difficulty Very difficult      Past Medical History:  Diagnosis Date   Arthritis    C5 & C6   Bipolar disorder ([place])    Diabetes mellitus without complication ([place])    Diabetes mellitus without complication ([place])    Borderline on metformin, DC's for the last year   Disc herniation    DVT (deep venous thrombosis) ([place])    Hypokalemia    Thyroid disease    Hypothyroid    Past Surgical History:  Procedure Laterality  Date   CESAREAN SECTION     X3   CESAREAN SECTION     x3   KNEE ARTHROSCOPY     KNEE SURGERY      Family History  Problem Relation Age of Onset   Cancer Mother        lukemia   Hypertension Mother    Other Mother        varicose veins   Cancer Father        prostate   Cancer Sister        colon   Cancer Brother        renal   Heart attack Brother        X2   Heart disease Brother    Hypertension Brother    Heart attack Brother    Heart disease Brother     Social History   Tobacco Use   Smoking status: Never   Smokeless tobacco: Never  Vaping Use   Vaping Use: Never used  Substance Use Topics   Alcohol use: Not Currently    Comment: stopped using in 12/2011   Drug use: No     Allergies  Allergen Reactions   Ambien [Zolpidem] Other (See Comments)    Sleep walk, eat, and drive.   Zolpidem Other (See Comments)  Sleep walks   Zyprexa [Olanzapine] Itching and Swelling    Causes throat to swell   Zyprexa [Olanzapine] Hives    Review of Systems NEGATIVE UNLESS OTHERWISE INDICATED IN HPI      Objective:     BP (!) 142/76 (BP Location: Left Arm)   Pulse 75   Temp 98 F (36.7 C) (Temporal)   Ht '5\' 3"'$  (1.6 m)   Wt 195 lb (88.5 kg)   SpO2 98%   BMI 34.54 kg/m   Wt Readings from Last 3 Encounters:  08/30/22 195 lb (88.5 kg)  08/08/16 200 lb (90.7 kg)  04/26/13 179 lb (81.2 kg)    BP Readings from Last 3 Encounters:  08/30/22 (!) 142/76  08/08/16 158/73  04/27/13 125/79     Physical Exam Vitals reviewed.  Constitutional:      Appearance: Normal appearance. She is obese.  Cardiovascular:     Rate and Rhythm: Normal rate and regular rhythm.  Pulmonary:     Effort: Pulmonary effort is normal.     Breath sounds: Normal breath sounds.  Musculoskeletal:     Right knee: Swelling (minimal diffuse anterior swelling compared to left knee) present. No crepitus. Normal range of motion. Tenderness present over the medial joint line.  Neurological:      General: No focal deficit present.     Mental Status: She is alert and oriented to person, place, and time.  Psychiatric:        Mood and Affect: Mood normal.        Behavior: Behavior normal.        Assessment & Plan:  Acute pain of right knee -     DG Knee Complete 4 Views Right; Future  Essential hypertension Assessment & Plan: Blood pressure slightly elevated in office today.  States she is getting normal readings at home in the 130s over 80s.  She will continue on Hyzaar 100-25 mg once daily and amlodipine 10 mg once daily.  She will let me know if trending any higher.  Orders: -     CBC with Differential/Platelet -     Comprehensive metabolic panel  Hyperglycemia -     Comprehensive metabolic panel -     Hemoglobin A1c  Mixed hyperlipidemia Assessment & Plan: Check labs today. Atorvastatin has been suggested to her in the past.   Orders: -     Lipid panel  History of thyroid disorder -     TSH  Fibromyalgia Assessment & Plan: Doing well with Cymbalta 60 mg daily Encouraged continued work on lifestyle habits    Anxiety Assessment & Plan:    08/30/2022    8:51 AM  GAD 7 : Generalized Anxiety Score  Nervous, Anxious, on Edge 2  Control/stop worrying 2  Worry too much - different things 2  Trouble relaxing 2  Restless 2  Easily annoyed or irritable 0  Afraid - awful might happen 0  Total GAD 7 Score 10  Anxiety Difficulty Very difficult    Not well-controlled.  Currently taking BuSpar 7.5 mg 3 times daily.  She can consider increasing to 10 mg 3 times daily.  She has had extensive psychiatry workup and medications in her past, she has a whole folder with her today of prior medical records.  Feels like she has not had much success with anything working for her.  Talked to her about GeneSight testing.   Need for prophylactic vaccination against Streptococcus pneumoniae (pneumococcus) -     Pneumococcal conjugate  vaccine 20-valent        Return  in about 6 months (around 03/02/2023) for med recheck .  This note was prepared with assistance of Systems analyst. Occasional wrong-word or sound-a-like substitutions may have occurred due to the inherent limitations of voice recognition software.     Oseph Imburgia M Neah Sporrer, PA-C

## 2022-08-30 NOTE — Assessment & Plan Note (Signed)
Check labs today. Atorvastatin has been suggested to her in the past.

## 2022-08-30 NOTE — Patient Instructions (Addendum)
Welcome to [facility]! It was a pleasure meeting you today.  As discussed, Please schedule a 6 month follow up visit today.  Labs today at our office.   [facility] [address], [place] [address] Tel: [phone] Hours: M-F 8:30 am - 5:00 pm Closed for lunch 12:30 pm - 1:00 pm  Consider increasing buspar to 10 mg three times daily Consider GeneSight genetic testing   Stay active, good nutrition, sleep hygiene important  PLEASE NOTE:  If you had any LAB tests please let us know if you have not heard back within a few days. You may see your results on MyChart before we have a chance to review them but we will give you a call once they are reviewed by [place]. If we ordered any REFERRALS today, please let us know if you have not heard from their office within the next two weeks. Let us know through MyChart if you are needing REFILLS, or have your pharmacy send [place] the request. You can also use MyChart to communicate with me or any office staff.  Please try these tips to maintain a healthy lifestyle:  Eat most of your calories during the day when you are active. Eliminate processed foods including packaged sweets (pies, cakes, cookies), reduce intake of potatoes, white bread, white pasta, and white rice. Look for whole grain options, oat flour or almond flour.  Each meal should contain half fruits/vegetables, one quarter protein, and one quarter carbs (no bigger than a computer mouse).  Cut down on sweet beverages. This includes juice, soda, and sweet tea. Also watch fruit intake, though this is a healthier sweet option, it still contains natural sugar! Limit to 3 servings daily.  Drink at least 1 glass of water with each meal and aim for at least 8 glasses (64 ounces) per day.  Exercise at least 150 minutes every week to the best of your ability.    Take Care,  Darletta Noblett, PA-C  ;we

## 2022-08-30 NOTE — Assessment & Plan Note (Signed)
    08/30/2022    8:51 AM  GAD 7 : Generalized Anxiety Score  Nervous, Anxious, on Edge 2  Control/stop worrying 2  Worry too much - different things 2  Trouble relaxing 2  Restless 2  Easily annoyed or irritable 0  Afraid - awful might happen 0  Total GAD 7 Score 10  Anxiety Difficulty Very difficult    Not well-controlled.  Currently taking BuSpar 7.5 mg 3 times daily.  She can consider increasing to 10 mg 3 times daily.  She has had extensive psychiatry workup and medications in her past, she has a whole folder with her today of prior medical records.  Feels like she has not had much success with anything working for her.  Talked to her about GeneSight testing.

## 2022-08-30 NOTE — Assessment & Plan Note (Signed)
Blood pressure slightly elevated in office today.  States she is getting normal readings at home in the 130s over 80s.  She will continue on Hyzaar 100-25 mg once daily and amlodipine 10 mg once daily.  She will let me know if trending any higher.

## 2022-08-31 ENCOUNTER — Other Ambulatory Visit: Payer: Self-pay

## 2022-08-31 ENCOUNTER — Encounter: Payer: Self-pay | Admitting: Physician Assistant

## 2022-08-31 DIAGNOSIS — M25561 Pain in right knee: Secondary | ICD-10-CM

## 2022-08-31 DIAGNOSIS — Z1211 Encounter for screening for malignant neoplasm of colon: Secondary | ICD-10-CM

## 2022-08-31 NOTE — Telephone Encounter (Signed)
Ok to place both referrals for patient?

## 2022-09-04 ENCOUNTER — Other Ambulatory Visit (HOSPITAL_BASED_OUTPATIENT_CLINIC_OR_DEPARTMENT_OTHER): Payer: Self-pay | Admitting: Family Medicine

## 2022-09-04 DIAGNOSIS — M1711 Unilateral primary osteoarthritis, right knee: Secondary | ICD-10-CM

## 2022-09-06 ENCOUNTER — Other Ambulatory Visit: Payer: Self-pay

## 2022-09-06 DIAGNOSIS — E782 Mixed hyperlipidemia: Secondary | ICD-10-CM

## 2022-09-10 ENCOUNTER — Encounter: Payer: Self-pay | Admitting: Physician Assistant

## 2022-09-10 ENCOUNTER — Other Ambulatory Visit: Payer: Self-pay | Admitting: Physician Assistant

## 2022-09-19 ENCOUNTER — Telehealth: Payer: Self-pay | Admitting: Physician Assistant

## 2022-09-19 NOTE — Telephone Encounter (Signed)
Contacted Alison Cline to schedule their annual wellness visit. Appointment made for 10/02/2022.  [place] Direct Dial [phone]

## 2022-09-24 ENCOUNTER — Telehealth (INDEPENDENT_AMBULATORY_CARE_PROVIDER_SITE_OTHER): Payer: Medicare Other | Admitting: Physician Assistant

## 2022-09-24 ENCOUNTER — Encounter: Payer: Self-pay | Admitting: Physician Assistant

## 2022-09-24 VITALS — BP 135/82 | Temp 100.3°F | Ht 63.0 in | Wt 187.4 lb

## 2022-09-24 DIAGNOSIS — R059 Cough, unspecified: Secondary | ICD-10-CM

## 2022-09-24 DIAGNOSIS — R509 Fever, unspecified: Secondary | ICD-10-CM | POA: Diagnosis not present

## 2022-09-24 MED ORDER — PREDNISONE 20 MG PO TABS: 20.0000 mg | ORAL_TABLET | Freq: Two times a day (BID) | ORAL | 0 refills | Status: AC

## 2022-09-24 MED ORDER — AMOXICILLIN-POT CLAVULANATE 875-125 MG PO TABS: 1.0000 | ORAL_TABLET | Freq: Two times a day (BID) | ORAL | 0 refills | Status: AC

## 2022-09-24 MED ORDER — ALBUTEROL SULFATE HFA 108 (90 BASE) MCG/ACT IN AERS: 2.0000 | INHALATION_SPRAY | Freq: Four times a day (QID) | RESPIRATORY_TRACT | 2 refills | Status: DC | PRN

## 2022-09-24 MED ORDER — HYDROCOD POLI-CHLORPHE POLI ER 10-8 MG/5ML PO SUER: 5.0000 mL | Freq: Two times a day (BID) | ORAL | 0 refills | Status: AC

## 2022-09-24 NOTE — Progress Notes (Signed)
   Virtual Visit via Video Note  I connected with  Alison Cline  on 09/24/22 at 11:45 AM EDT by a video enabled telemedicine application and verified that I am speaking with the correct person using two identifiers.  Location: Patient: home Provider: Nature conservation officer at [employer] Persons present: Patient and myself   I discussed the limitations of evaluation and management by telemedicine and the availability of in person appointments. The patient expressed understanding and agreed to proceed.   History of Present Illness:  Chief complaint: Cough & fever  Symptom onset: 09/20/22 - started with fever and chills; next day cough and aching Pertinent positives: Cough, wheezing, worse at night when laying down  Pertinent negatives: No CP, no SOB, still running fever Treatments tried: Theraflu OTC  Vaccine status: PNA vaccine, doesn't have COVID or flu UTD  Sick exposure: Two co-workers with similar symptoms  Home COVID test neg 09/22/22  One year ago January - started with bronchitis, then developed pneumonia.   Hx of COVID-19 twice, no hx of COPD or asthma.    Observations/Objective:   Gen: Awake, alert, no acute distress, congested sounding Resp: Breathing is even and non-labored, some cough noted Psych: calm/pleasant demeanor Neuro: Alert and Oriented x 3, + facial symmetry, speech is clear.   Assessment and Plan:  1. Cough with fever Pt was able to come to the office after our virtual visit for POC COVID-19 and Flu testing, both of which were negative. Will start on Augmentin and prednisone at this time. Albuterol inhaler and Tussionex for added relief. Pt aware of risks vs benefits and possible adverse reactions. Recheck in person prn.   Follow Up Instructions:    I discussed the assessment and treatment plan with the patient. The patient was provided an opportunity to ask questions and all were answered. The patient agreed with the plan and demonstrated an  understanding of the instructions.   The patient was advised to call back or seek an in-person evaluation if the symptoms worsen or if the condition fails to improve as anticipated.  Amneet Cendejas M Shadeed Colberg, PA-C

## 2022-09-28 ENCOUNTER — Encounter (HOSPITAL_BASED_OUTPATIENT_CLINIC_OR_DEPARTMENT_OTHER): Payer: Self-pay

## 2022-09-28 ENCOUNTER — Ambulatory Visit (HOSPITAL_BASED_OUTPATIENT_CLINIC_OR_DEPARTMENT_OTHER)
Admission: RE | Admit: 2022-09-28 | Discharge: 2022-09-28 | Disposition: A | Discharge status: Home / Self Care | Payer: Medicare Other | Source: Ambulatory Visit | Attending: Family Medicine | Admitting: Family Medicine

## 2022-09-28 DIAGNOSIS — M1711 Unilateral primary osteoarthritis, right knee: Secondary | ICD-10-CM | POA: Diagnosis present

## 2022-09-28 HISTORY — DX: Essential (primary) hypertension: I10

## 2022-10-02 ENCOUNTER — Ambulatory Visit: Payer: Medicare Other

## 2022-10-11 ENCOUNTER — Ambulatory Visit (INDEPENDENT_AMBULATORY_CARE_PROVIDER_SITE_OTHER): Payer: Medicare Other

## 2022-10-11 VITALS — BP 130/84 | HR 81 | Wt 196.4 lb

## 2022-10-11 DIAGNOSIS — Z0000 Encounter for general adult medical examination without abnormal findings: Secondary | ICD-10-CM

## 2022-10-11 NOTE — Progress Notes (Signed)
Subjective:   Alison Cline is a 67 y.o. female who presents for Medicare Annual (Subsequent) preventive examination.  Review of Systems    Cardiac Risk Factors include: advanced age (>47men, >31 women);dyslipidemia;hypertension;obesity (BMI >30kg/m2)     Objective:    Today's Vitals   10/11/22 0954 10/11/22 1017  BP: (!) 140/100 130/84  Pulse: 81   SpO2: 96%   Weight: 196 lb 6.4 oz (89.1 kg)    Body mass index is 34.79 kg/m.     10/11/2022   10:08 AM 08/08/2016   12:49 PM  Advanced Directives  Does Patient Have a Medical Advance Directive? Yes No  Type of Estate agent of [place];Living will   Copy of Healthcare Power of Attorney in Chart? No - copy requested     Current Medications (verified) Outpatient Encounter Medications as of 10/11/2022  Medication Sig   albuterol (VENTOLIN HFA) 108 (90 Base) MCG/ACT inhaler Inhale 2 puffs into the lungs every 6 (six) hours as needed for wheezing or shortness of breath.   amLODipine (NORVASC) 10 MG tablet Take 10 mg by mouth daily.   busPIRone (BUSPAR) 7.5 MG tablet Take 7.5 mg by mouth 3 (three) times daily.   DULoxetine (CYMBALTA) 60 MG capsule Take 60 mg by mouth daily.   losartan-hydrochlorothiazide (HYZAAR) 100-25 MG tablet Take 1 tablet by mouth daily.   Multiple Vitamin (MULTIVITAMIN WITH MINERALS) TABS tablet Take 1 tablet by mouth every morning.   No facility-administered encounter medications on file as of 10/11/2022.    Allergies (verified) Ambien [zolpidem], Zolpidem, Zyprexa [olanzapine], and Zyprexa [olanzapine]   History: Past Medical History:  Diagnosis Date   Arthritis    C5 & C6   Bipolar disorder    Diabetes mellitus without complication    Diabetes mellitus without complication    Borderline on metformin, DC's for the last year   Disc herniation    DVT (deep venous thrombosis)    Hypertension    Hypokalemia    Thyroid disease    Hypothyroid   Past Surgical History:   Procedure Laterality Date   CESAREAN SECTION     X3   CESAREAN SECTION     x3   KNEE ARTHROSCOPY Left    KNEE SURGERY Left    Family History  Problem Relation Age of Onset   Cancer Mother        lukemia   Hypertension Mother    Other Mother        varicose veins   Cancer Father        prostate   Cancer Sister        colon   Cancer Brother        renal   Heart attack Brother        X2   Heart disease Brother    Hypertension Brother    Heart attack Brother    Heart disease Brother    Social History   Socioeconomic History   Marital status: Widowed    Spouse name: Not on file   Number of children: Not on file   Years of education: Not on file   Highest education level: Not on file  Occupational History   Not on file  Tobacco Use   Smoking status: Never   Smokeless tobacco: Never  Vaping Use   Vaping Use: Never used  Substance and Sexual Activity   Alcohol use: Not Currently    Comment: stopped using in 12/2011   Drug use: No  Sexual activity: Yes    Birth control/protection: Surgical    Comment: BTL  Other Topics Concern   Not on file  Social History Narrative   ** Merged History Encounter **       Social Determinants of Health   Financial Resource Strain: Low Risk  (10/11/2022)   Overall Financial Resource Strain (CARDIA)    Difficulty of Paying Living Expenses: Not hard at all  Food Insecurity: No Food Insecurity (10/11/2022)   Hunger Vital Sign    Worried About Running Out of Food in the Last Year: Never true    Ran Out of Food in the Last Year: Never true  Transportation Needs: No Transportation Needs (10/11/2022)   PRAPARE - Administrator, Civil Service (Medical): No    Lack of Transportation (Non-Medical): No  Physical Activity: Inactive (10/11/2022)   Exercise Vital Sign    Days of Exercise per Week: 0 days    Minutes of Exercise per Session: 0 min  Stress: No Stress Concern Present (10/11/2022)   [employer] of  Occupational Health - Occupational Stress Questionnaire    Feeling of Stress : Only a little  Social Connections: Moderately Isolated (10/11/2022)   Social Connection and Isolation Panel [NHANES]    Frequency of Communication with Friends and Family: More than three times a week    Frequency of Social Gatherings with Friends and Family: More than three times a week    Attends Religious Services: More than 4 times per year    Active Member of [employer] or Organizations: No    Attends Banker Meetings: Never    Marital Status: Widowed    Tobacco Counseling Counseling given: Not Answered   Clinical Intake:  Pre-visit preparation completed: Yes  Pain : No/denies pain     BMI - recorded: 34.79 Nutritional Status: BMI > 30  Obese Nutritional Risks: None Diabetes: No  How often do you need to have someone help you when you read instructions, pamphlets, or other written materials from your doctor or pharmacy?: 1 - Never  Diabetic?no  Interpreter Needed?: No  Information entered by :: Lanier Ensign, LPN   Activities of Daily Living    10/11/2022   10:11 AM  In your present state of health, do you have any difficulty performing the following activities:  Hearing? 0  Vision? 0  Difficulty concentrating or making decisions? 0  Walking or climbing stairs? 0  Dressing or bathing? 0  Doing errands, shopping? 0  Preparing Food and eating ? N  Using the Toilet? N  In the past six months, have you accidently leaked urine? N  Do you have problems with loss of bowel control? N  Managing your Medications? N  Managing your Finances? N  Housekeeping or managing your Housekeeping? N    Patient Care Team: Allwardt, Crist Infante, PA-C as PCP - General (Physician Assistant) Kirkland Hun, MD as Attending Physician (Obstetrics and Gynecology) Gretel Acre, MD (Family Medicine)  Indicate any recent Medical Services you may have received from other than [facility] providers in the  past year (date may be approximate).     Assessment:   This is a routine wellness examination for Alison Cline.  Hearing/Vision screen Hearing Screening - Comments:: Pt denies any hearing issues Vision Screening - Comments:: Pt follows up with Dr Shea Evans for annual eye exams   Dietary issues and exercise activities discussed: Current Exercise Habits: The patient does not participate in regular exercise at present   Goals Addressed  This Visit's Progress    Patient Stated       Lose weight        Depression Screen    10/11/2022   10:03 AM 08/30/2022    8:50 AM  PHQ 2/9 Scores  PHQ - 2 Score 1 0  PHQ- 9 Score 4 2    Fall Risk    10/11/2022   10:10 AM 08/30/2022    8:50 AM  Fall Risk   Falls in the past year? 1 1  Number falls in past yr: 1 0  Injury with Fall? 1 1  Comment right knee injury   Risk for fall due to : Impaired mobility;Impaired vision Impaired mobility  Follow up Falls prevention discussed Falls prevention discussed    FALL RISK PREVENTION PERTAINING TO THE HOME:  Any stairs in or around the home? Yes  If so, are there any without handrails? No  Home free of loose throw rugs in walkways, pet beds, electrical cords, etc? Yes  Adequate lighting in your home to reduce risk of falls? Yes   ASSISTIVE DEVICES UTILIZED TO PREVENT FALLS:  Life alert? No  Use of a cane, walker or w/c? No  Grab bars in the bathroom? No  Shower chair or bench in shower? No  Elevated toilet seat or a handicapped toilet? No   TIMED UP AND GO:  Was the test performed? Yes .  Length of time to ambulate 10 feet: 10 sec.   Gait steady and fast without use of assistive device  Cognitive Function:        10/11/2022   10:12 AM  6CIT Screen  What Year? 0 points  What month? 0 points  What time? 0 points  Count back from 20 0 points  Months in reverse 0 points  Repeat phrase 0 points  Total Score 0 points    Immunizations Immunization History  Administered  Date(s) Administered   PNEUMOCOCCAL CONJUGATE-20 08/30/2022    TDAP status: Due, Education has been provided regarding the importance of this vaccine. Advised may receive this vaccine at local pharmacy or Health Dept. Aware to provide a copy of the vaccination record if obtained from local pharmacy or Health Dept. Verbalized acceptance and understanding.  Flu Vaccine status: Declined, Education has been provided regarding the importance of this vaccine but patient still declined. Advised may receive this vaccine at local pharmacy or Health Dept. Aware to provide a copy of the vaccination record if obtained from local pharmacy or Health Dept. Verbalized acceptance and understanding.  Pneumococcal vaccine status: Up to date  Covid-19 vaccine status: Declined, Education has been provided regarding the importance of this vaccine but patient still declined. Advised may receive this vaccine at local pharmacy or Health Dept.or vaccine clinic. Aware to provide a copy of the vaccination record if obtained from local pharmacy or Health Dept. Verbalized acceptance and understanding.  Qualifies for Shingles Vaccine? Yes   Zostavax completed No   Shingrix Completed?: No.    Education has been provided regarding the importance of this vaccine. Patient has been advised to call insurance company to determine out of pocket expense if they have not yet received this vaccine. Advised may also receive vaccine at local pharmacy or Health Dept. Verbalized acceptance and understanding.  Screening Tests Health Maintenance  Topic Date Due   DTaP/Tdap/Td (1 - Tdap) Never done   Zoster Vaccines- Shingrix (1 of 2) 11/30/2022 (Originally 06/24/2005)   Hepatitis C Screening  08/30/2023 (Originally 06/24/1973)   COLONOSCOPY (Pts  45-53yrs Insurance coverage will need to be confirmed)  10/11/2023 (Originally 06/24/2000)   INFLUENZA VACCINE  01/17/2023   MAMMOGRAM  03/09/2023   Medicare Annual Wellness (AWV)  10/11/2023    Pneumonia Vaccine 64+ Years old  Completed   DEXA SCAN  Completed   HPV VACCINES  Aged Out   COVID-19 Vaccine  Discontinued    Health Maintenance  Health Maintenance Due  Topic Date Due   DTaP/Tdap/Td (1 - Tdap) Never done    Coloscopy postponed until knee injury subsides  Mammogram status: Completed 03/08/22. Repeat every year  Bone Density status: Completed 08/01/21. Results reflect: Bone density results: OSTEOPENIA. Repeat every 2 years.   Additional Screening:  Hepatitis C Screening: does qualify;   Vision Screening: Recommended annual ophthalmology exams for early detection of glaucoma and other disorders of the eye. Is the patient up to date with their annual eye exam?  Yes  Who is the provider or what is the name of the office in which the patient attends annual eye exams? Dr Shea Evans  If pt is not established with a provider, would they like to be referred to a provider to establish care? No .   Dental Screening: Recommended annual dental exams for proper oral hygiene  Community Resource Referral / Chronic Care Management: CRR required this visit?  No   CCM required this visit?  No      Plan:     I have personally reviewed and noted the following in the patient's chart:   Medical and social history Use of alcohol, tobacco or illicit drugs  Current medications and supplements including opioid prescriptions. Patient is not currently taking opioid prescriptions. Functional ability and status Nutritional status Physical activity Advanced directives List of other physicians Hospitalizations, surgeries, and ER visits in previous 12 months Vitals Screenings to include cognitive, depression, and falls Referrals and appointments  In addition, I have reviewed and discussed with patient certain preventive protocols, quality metrics, and best practice recommendations. A written personalized care plan for preventive services as well as general preventive health  recommendations were provided to patient.     Marzella Schlein, LPN   [id number]   Nurse Notes: pt stated at her visit it was discussed to increase medication Cymbalta and a change for the Buspar related to not being effective at this time pt will send you an indebt mychart message as well for communication between you two

## 2022-10-11 NOTE — Patient Instructions (Signed)
[name] , Thank you for taking time to come for your Medicare Wellness Visit. I appreciate your ongoing commitment to your health goals. Please review the following plan we discussed and let me know if I can assist you in the future.   These are the goals we discussed:  Goals   None     This is a list of the screening recommended for you and due dates:  Health Maintenance  Topic Date Due   DTaP/Tdap/Td vaccine (1 - Tdap) Never done   Colon Cancer Screening  Never done   Zoster (Shingles) Vaccine (1 of 2) 11/30/2022*   Hepatitis C Screening: USPSTF Recommendation to screen - Ages 18-79 yo.  08/30/2023*   Flu Shot  01/17/2023   Mammogram  03/09/2023   Medicare Annual Wellness Visit  10/11/2023   Pneumonia Vaccine  Completed   DEXA scan (bone density measurement)  Completed   HPV Vaccine  Aged Out   COVID-19 Vaccine  Discontinued  *Topic was postponed. The date shown is not the original due date.    Advanced directives: Please bring a copy of your health care power of attorney and living will to the office at your convenience.  Conditions/risks identified: lose weight   Next appointment: Follow up in one year for your annual wellness visit    Preventive Care 65 Years and Older, Female Preventive care refers to lifestyle choices and visits with your health care provider that can promote health and wellness. What does preventive care include? A yearly physical exam. This is also called an annual well check. Dental exams once or twice a year. Routine eye exams. Ask your health care provider how often you should have your eyes checked. Personal lifestyle choices, including: Daily care of your teeth and gums. Regular physical activity. Eating a healthy diet. Avoiding tobacco and drug use. Limiting alcohol use. Practicing safe sex. Taking low-dose aspirin every day. Taking vitamin and mineral supplements as recommended by your health care provider. What happens during an  annual well check? The services and screenings done by your health care provider during your annual well check will depend on your age, overall health, lifestyle risk factors, and family history of disease. Counseling  Your health care provider may ask you questions about your: Alcohol use. Tobacco use. Drug use. Emotional well-being. Home and relationship well-being. Sexual activity. Eating habits. History of falls. Memory and ability to understand (cognition). Work and work Astronomer. Reproductive health. Screening  You may have the following tests or measurements: Height, weight, and BMI. Blood pressure. Lipid and cholesterol levels. These may be checked every 5 years, or more frequently if you are over 77 years old. Skin check. Lung cancer screening. You may have this screening every year starting at age 67 if you have a 30-pack-year history of smoking and currently smoke or have quit within the past 15 years. Fecal occult blood test (FOBT) of the stool. You may have this test every year starting at age 75. Flexible sigmoidoscopy or colonoscopy. You may have a sigmoidoscopy every 5 years or a colonoscopy every 10 years starting at age 31. Hepatitis C blood test. Hepatitis B blood test. Sexually transmitted disease (STD) testing. Diabetes screening. This is done by checking your blood sugar (glucose) after you have not eaten for a while (fasting). You may have this done every 1-3 years. Bone density scan. This is done to screen for osteoporosis. You may have this done starting at age 34. Mammogram. This may be done every  1-2 years. Talk to your health care provider about how often you should have regular mammograms. Talk with your health care provider about your test results, treatment options, and if necessary, the need for more tests. Vaccines  Your health care provider may recommend certain vaccines, such as: Influenza vaccine. This is recommended every year. Tetanus,  diphtheria, and acellular pertussis (Tdap, Td) vaccine. You may need a Td booster every 10 years. Zoster vaccine. You may need this after age 10. Pneumococcal 13-valent conjugate (PCV13) vaccine. One dose is recommended after age 49. Pneumococcal polysaccharide (PPSV23) vaccine. One dose is recommended after age 71. Talk to your health care provider about which screenings and vaccines you need and how often you need them. This information is not intended to replace advice given to you by your health care provider. Make sure you discuss any questions you have with your health care provider. Document Released: 07/01/2015 Document Revised: 02/22/2016 Document Reviewed: 04/05/2015 Elsevier Interactive Patient Education  2017 [employer].  Fall Prevention in the Home Falls can cause injuries. They can happen to people of all ages. There are many things you can do to make your home safe and to help prevent falls. What can I do on the outside of my home? Regularly fix the edges of walkways and driveways and fix any cracks. Remove anything that might make you trip as you walk through a door, such as a raised step or threshold. Trim any bushes or trees on the path to your home. Use bright outdoor lighting. Clear any walking paths of anything that might make someone trip, such as rocks or tools. Regularly check to see if handrails are loose or broken. Make sure that both sides of any steps have handrails. Any raised decks and porches should have guardrails on the edges. Have any leaves, snow, or ice cleared regularly. Use sand or salt on walking paths during winter. Clean up any spills in your garage right away. This includes oil or grease spills. What can I do in the bathroom? Use night lights. Install grab bars by the toilet and in the tub and shower. Do not use towel bars as grab bars. Use non-skid mats or decals in the tub or shower. If you need to sit down in the shower, use a plastic,  non-slip stool. Keep the floor dry. Clean up any water that spills on the floor as soon as it happens. Remove soap buildup in the tub or shower regularly. Attach bath mats securely with double-sided non-slip rug tape. Do not have throw rugs and other things on the floor that can make you trip. What can I do in the bedroom? Use night lights. Make sure that you have a light by your bed that is easy to reach. Do not use any sheets or blankets that are too big for your bed. They should not hang down onto the floor. Have a firm chair that has side arms. You can use this for support while you get dressed. Do not have throw rugs and other things on the floor that can make you trip. What can I do in the kitchen? Clean up any spills right away. Avoid walking on wet floors. Keep items that you use a lot in easy-to-reach places. If you need to reach something above you, use a strong step stool that has a grab bar. Keep electrical cords out of the way. Do not use floor polish or wax that makes floors slippery. If you must use wax, use non-skid  floor wax. Do not have throw rugs and other things on the floor that can make you trip. What can I do with my stairs? Do not leave any items on the stairs. Make sure that there are handrails on both sides of the stairs and use them. Fix handrails that are broken or loose. Make sure that handrails are as long as the stairways. Check any carpeting to make sure that it is firmly attached to the stairs. Fix any carpet that is loose or worn. Avoid having throw rugs at the top or bottom of the stairs. If you do have throw rugs, attach them to the floor with carpet tape. Make sure that you have a light switch at the top of the stairs and the bottom of the stairs. If you do not have them, ask someone to add them for you. What else can I do to help prevent falls? Wear shoes that: Do not have high heels. Have rubber bottoms. Are comfortable and fit you well. Are closed  at the toe. Do not wear sandals. If you use a stepladder: Make sure that it is fully opened. Do not climb a closed stepladder. Make sure that both sides of the stepladder are locked into place. Ask someone to hold it for you, if possible. Clearly mark and make sure that you can see: Any grab bars or handrails. First and last steps. Where the edge of each step is. Use tools that help you move around (mobility aids) if they are needed. These include: Canes. Walkers. Scooters. Crutches. Turn on the lights when you go into a dark area. Replace any light bulbs as soon as they burn out. Set up your furniture so you have a clear path. Avoid moving your furniture around. If any of your floors are uneven, fix them. If there are any pets around you, be aware of where they are. Review your medicines with your doctor. Some medicines can make you feel dizzy. This can increase your chance of falling. Ask your doctor what other things that you can do to help prevent falls. This information is not intended to replace advice given to you by your health care provider. Make sure you discuss any questions you have with your health care provider. Document Released: 03/31/2009 Document Revised: 11/10/2015 Document Reviewed: 07/09/2014 Elsevier Interactive Patient Education  2017 [employer].

## 2023-01-31 ENCOUNTER — Encounter (INDEPENDENT_AMBULATORY_CARE_PROVIDER_SITE_OTHER): Payer: Self-pay

## 2023-02-26 ENCOUNTER — Encounter: Payer: Self-pay | Admitting: Physician Assistant

## 2023-03-05 ENCOUNTER — Ambulatory Visit (INDEPENDENT_AMBULATORY_CARE_PROVIDER_SITE_OTHER): Payer: Medicare Other | Admitting: Physician Assistant

## 2023-03-05 ENCOUNTER — Encounter: Payer: Self-pay | Admitting: Physician Assistant

## 2023-03-05 VITALS — BP 128/68 | HR 74 | Temp 97.7°F | Ht 63.0 in | Wt 206.2 lb

## 2023-03-05 DIAGNOSIS — E782 Mixed hyperlipidemia: Secondary | ICD-10-CM | POA: Diagnosis not present

## 2023-03-05 DIAGNOSIS — M797 Fibromyalgia: Secondary | ICD-10-CM | POA: Diagnosis not present

## 2023-03-05 DIAGNOSIS — I10 Essential (primary) hypertension: Secondary | ICD-10-CM | POA: Diagnosis not present

## 2023-03-05 DIAGNOSIS — F419 Anxiety disorder, unspecified: Secondary | ICD-10-CM

## 2023-03-05 DIAGNOSIS — Z23 Encounter for immunization: Secondary | ICD-10-CM | POA: Diagnosis not present

## 2023-03-05 DIAGNOSIS — Z1211 Encounter for screening for malignant neoplasm of colon: Secondary | ICD-10-CM

## 2023-03-05 LAB — LIPID PANEL
Cholesterol: 204 mg/dL — ABNORMAL HIGH (ref 0–200)
HDL: 58.8 mg/dL (ref >39.00)
LDL Cholesterol: 120 mg/dL — ABNORMAL HIGH (ref 0–99)
NonHDL: 145.49
Total CHOL/HDL Ratio: 3
Triglycerides: 129 mg/dL (ref 0.0–149.0)
VLDL: 25.8 mg/dL (ref 0.0–40.0)

## 2023-03-05 MED ORDER — AMLODIPINE BESYLATE 10 MG PO TABS: 10.0000 mg | ORAL_TABLET | Freq: Every day | ORAL | 3 refills | Status: AC

## 2023-03-05 MED ORDER — DULOXETINE HCL 60 MG PO CPEP: 60.0000 mg | ORAL_CAPSULE | Freq: Every day | ORAL | 3 refills | Status: AC

## 2023-03-05 MED ORDER — ATORVASTATIN CALCIUM 10 MG PO TABS
10.0000 mg | ORAL_TABLET | Freq: Every day | ORAL | 3 refills | Status: DC
Start: 2023-03-05 — End: 2024-01-07

## 2023-03-05 MED ORDER — LOSARTAN POTASSIUM-HCTZ 100-25 MG PO TABS
1.0000 | ORAL_TABLET | Freq: Every day | ORAL | 3 refills | Status: AC
Start: 2023-03-05 — End: ?

## 2023-03-05 NOTE — Assessment & Plan Note (Signed)
Doing well with Cymbalta 60 mg daily Encouraged continued work on lifestyle habits  Refilled

## 2023-03-05 NOTE — Progress Notes (Signed)
Subjective:    Patient ID: Alison Cline, female    DOB: 10/04/1955, 67 y.o.   MRN: [id number]  Chief Complaint  Patient presents with   6 MO FU     Med Re-check, Refills needed    HPI Patient is in today for 6 month follow-up.  Doing well since the last time we met.  States no new concerns.  No changes in medical history in the interim. Mammogram scheduled for 10/31 this year.   Past Medical History:  Diagnosis Date   Arthritis    C5 & C6   Bipolar disorder (HCC)    Diabetes mellitus without complication (HCC)    Diabetes mellitus without complication (HCC)    Borderline on metformin, DC's for the last year   Disc herniation    DVT (deep venous thrombosis) (HCC)    Hypertension    Hypokalemia    Thyroid disease    Hypothyroid    Past Surgical History:  Procedure Laterality Date   CESAREAN SECTION     X3   CESAREAN SECTION     x3   KNEE ARTHROSCOPY Left    KNEE SURGERY Left     Family History  Problem Relation Age of Onset   Cancer Mother        lukemia   Hypertension Mother    Other Mother        varicose veins   Cancer Father        prostate   Cancer Sister        colon   Cancer Brother        renal   Heart attack Brother        X2   Heart disease Brother    Hypertension Brother    Heart attack Brother    Heart disease Brother     Social History   Tobacco Use   Smoking status: Never   Smokeless tobacco: Never  Vaping Use   Vaping status: Never Used  Substance Use Topics   Alcohol use: Not Currently    Comment: stopped using in 12/2011   Drug use: No     Allergies  Allergen Reactions   Ambien [Zolpidem] Other (See Comments)    Sleep walk, eat, and drive.   Zolpidem Other (See Comments)    Sleep walks   Zyprexa [Olanzapine] Itching and Swelling    Causes throat to swell   Zyprexa [Olanzapine] Hives    Review of Systems NEGATIVE UNLESS OTHERWISE INDICATED IN HPI      Objective:     BP 128/68   Pulse 74   Temp 97.7 F  (36.5 C) (Temporal)   Ht 5\' 3"  (1.6 m)   Wt 206 lb 3.2 oz (93.5 kg)   SpO2 98%   BMI 36.53 kg/m   Wt Readings from Last 3 Encounters:  03/05/23 206 lb 3.2 oz (93.5 kg)  10/11/22 196 lb 6.4 oz (89.1 kg)  09/24/22 187 lb 6.4 oz (85 kg)    BP Readings from Last 3 Encounters:  03/05/23 128/68  10/11/22 130/84  09/24/22 135/82     Physical Exam Vitals and nursing note reviewed.  Constitutional:      Appearance: Normal appearance. She is obese.  Eyes:     Extraocular Movements: Extraocular movements intact.     Conjunctiva/sclera: Conjunctivae normal.     Pupils: Pupils are equal, round, and reactive to light.  Cardiovascular:     Rate and Rhythm: Normal rate and regular rhythm.  Pulses: Normal pulses.     Heart sounds: No murmur heard. Pulmonary:     Effort: Pulmonary effort is normal.     Breath sounds: Normal breath sounds.  Neurological:     General: No focal deficit present.     Mental Status: She is alert and oriented to person, place, and time.  Psychiatric:        Mood and Affect: Mood normal.        Assessment & Plan:  Essential hypertension Assessment & Plan: Chronic, stable, normotensive. She will continue on Hyzaar 100-25 mg once daily and amlodipine 10 mg once daily.  Refilled  Orders: -     amLODIPine Besylate; Take 1 tablet (10 mg total) by mouth daily.  Dispense: 90 tablet; Refill: 3 -     Losartan Potassium-HCTZ; Take 1 tablet by mouth daily.  Dispense: 90 tablet; Refill: 3  Mixed hyperlipidemia Assessment & Plan: Lab Results  Component Value Date   CHOL 269 (H) 08/30/2022   HDL 62.10 08/30/2022   LDLDIRECT 154.0 08/30/2022   TRIG 272.0 (H) 08/30/2022   CHOLHDL 4 08/30/2022   The 10-year ASCVD risk score (Arnett DK, et al., 2019) is: 10%   Values used to calculate the score:     Age: 67 years     Sex: Female     Is Non-Hispanic African American: No     Diabetic: No     Tobacco smoker: No     Systolic Blood Pressure: 128 mmHg     Is  BP treated: Yes     HDL Cholesterol: 62.1 mg/dL     Total Cholesterol: 269 mg/dL  Recheck lipid panel today.  She has started on Lipitor 10 mg.  Tolerating well.  Adjust according to lipid panel.  Orders: -     Lipid panel -     Atorvastatin Calcium; Take 1 tablet (10 mg total) by mouth daily.  Dispense: 90 tablet; Refill: 3  Anxiety Assessment & Plan:    03/05/2023    9:04 AM 08/30/2022    8:51 AM  GAD 7 : Generalized Anxiety Score  Nervous, Anxious, on Edge 1 2  Control/stop worrying 1 2  Worry too much - different things 1 2  Trouble relaxing 1 2  Restless 0 2  Easily annoyed or irritable 0 0  Afraid - awful might happen 0 0  Total GAD 7 Score 4 10  Anxiety Difficulty Somewhat difficult Very difficult    Greatly improved.  No longer taking buspirone, did not feel like this was working.  Plans to continue Cymbalta 60 mg at this time.  She will reach out if any changes.   Screening for colon cancer -     Ambulatory referral to Gastroenterology  Fibromyalgia Assessment & Plan: Doing well with Cymbalta 60 mg daily Encouraged continued work on lifestyle habits  Refilled  Orders: -     DULoxetine HCl; Take 1 capsule (60 mg total) by mouth daily.  Dispense: 90 capsule; Refill: 3  Immunization due -     Tdap vaccine greater than or equal to 7yo IM        Return in about 6 months (around 09/02/2023) for recheck/follow-up, fasting lipid panel .  This note was prepared with assistance of Conservation officer, historic buildings. Occasional wrong-word or sound-a-like substitutions may have occurred due to the inherent limitations of voice recognition software.     Rahkeem Senft M Karla Vines, PA-C

## 2023-03-05 NOTE — Assessment & Plan Note (Signed)
Chronic, stable, normotensive. She will continue on Hyzaar 100-25 mg once daily and amlodipine 10 mg once daily.  Refilled

## 2023-03-05 NOTE — Assessment & Plan Note (Signed)
    03/05/2023    9:04 AM 08/30/2022    8:51 AM  GAD 7 : Generalized Anxiety Score  Nervous, Anxious, on Edge 1 2  Control/stop worrying 1 2  Worry too much - different things 1 2  Trouble relaxing 1 2  Restless 0 2  Easily annoyed or irritable 0 0  Afraid - awful might happen 0 0  Total GAD 7 Score 4 10  Anxiety Difficulty Somewhat difficult Very difficult    Greatly improved.  No longer taking buspirone, did not feel like this was working.  Plans to continue Cymbalta 60 mg at this time.  She will reach out if any changes.

## 2023-03-05 NOTE — Assessment & Plan Note (Signed)
Lab Results  Component Value Date   CHOL 269 (H) 08/30/2022   HDL 62.10 08/30/2022   LDLDIRECT 154.0 08/30/2022   TRIG 272.0 (H) 08/30/2022   CHOLHDL 4 08/30/2022   The 10-year ASCVD risk score (Arnett DK, et al., 2019) is: 10%   Values used to calculate the score:     Age: 67 years     Sex: Female     Is Non-Hispanic African American: No     Diabetic: No     Tobacco smoker: No     Systolic Blood Pressure: 128 mmHg     Is BP treated: Yes     HDL Cholesterol: 62.1 mg/dL     Total Cholesterol: 269 mg/dL  Recheck lipid panel today.  She has started on Lipitor 10 mg.  Tolerating well.  Adjust according to lipid panel.

## 2023-05-04 DIAGNOSIS — Z1231 Encounter for screening mammogram for malignant neoplasm of breast: Secondary | ICD-10-CM | POA: Diagnosis not present

## 2023-05-04 LAB — HM MAMMOGRAPHY

## 2023-05-09 ENCOUNTER — Encounter: Payer: Self-pay | Admitting: Physician Assistant

## 2023-05-10 NOTE — Telephone Encounter (Signed)
Please see patient message and advise.

## 2023-05-13 NOTE — Telephone Encounter (Signed)
FYI: Patient scheduled with Sam tomorrow

## 2023-05-14 ENCOUNTER — Encounter: Payer: Self-pay | Admitting: Physician Assistant

## 2023-05-14 ENCOUNTER — Ambulatory Visit (INDEPENDENT_AMBULATORY_CARE_PROVIDER_SITE_OTHER): Payer: Medicare Other | Admitting: Physician Assistant

## 2023-05-14 VITALS — BP 136/78 | HR 69 | Temp 98.9°F | Ht 63.0 in | Wt 206.4 lb

## 2023-05-14 DIAGNOSIS — M797 Fibromyalgia: Secondary | ICD-10-CM | POA: Diagnosis not present

## 2023-05-14 MED ORDER — PREDNISONE 20 MG PO TABS: 40.0000 mg | ORAL_TABLET | Freq: Two times a day (BID) | ORAL | 0 refills | Status: DC

## 2023-05-14 MED ORDER — METHYLPREDNISOLONE ACETATE 80 MG/ML IJ SUSP
80.0000 mg | Freq: Once | INTRAMUSCULAR | Status: AC
Start: 2023-05-14 — End: 2023-05-14
  Administered 2023-05-14: 80 mg via INTRAMUSCULAR

## 2023-05-14 MED ORDER — PREDNISONE 20 MG PO TABS: 40.0000 mg | ORAL_TABLET | Freq: Every day | ORAL | 0 refills | Status: DC

## 2023-05-14 NOTE — Progress Notes (Signed)
Alison Cline is a 67 y.o. female here for a follow-up of a pre-existing problem. History of Present Illness:   Chief Complaint  Patient presents with   Fibromylagia    Pt c/o fibromylagia flare x 4 weeks.   HPI Fibromyalgia: 11/21 via MyChart she reported that she had been having a fibromyalgia flare-up for the past 3 weeks.  Endorsed symptoms affecting daily life causing difficulty sleep and her to miss work. Noted usual reliefs (Epsom salt soaks, heating pad, rest, and remaining mobile) not alleviating pain.   Today she reports that she is now in her 4th week of her flare-up. Endorses being under a lot of stress recently (may have been the trigger for this flare-up ), and difficulty sleeping due to her pain. Rates the most painful areas being her neck, shoulders, and arms. Describes pain in her neck and shoulders feels like "nerves are pulling apart", as well as feeling like "my arms are broken" due to the pain.  Takes 60 mg Cymbalta and Aleve up to 3x daily - has tried Gabapentin in the past but even the max dose didn't alleviate her pain.  Has been on Prednisone before - denies side effects.   Past Medical History:  Diagnosis Date   Arthritis    C5 & C6   Bipolar disorder (HCC)    Diabetes mellitus without complication (HCC)    Diabetes mellitus without complication (HCC)    Borderline on metformin, DC's for the last year   Disc herniation    DVT (deep venous thrombosis) (HCC)    Hypertension    Hypokalemia    Thyroid disease    Hypothyroid    Social History   Tobacco Use   Smoking status: Never   Smokeless tobacco: Never  Vaping Use   Vaping status: Never Used  Substance Use Topics   Alcohol use: Not Currently    Comment: stopped using in 12/2011   Drug use: No   Past Surgical History:  Procedure Laterality Date   CESAREAN SECTION     X3   CESAREAN SECTION     x3   KNEE ARTHROSCOPY Left    KNEE SURGERY Left    Family History  Problem Relation Age of  Onset   Cancer Mother        lukemia   Hypertension Mother    Other Mother        varicose veins   Cancer Father        prostate   Cancer Sister        colon   Cancer Brother        renal   Heart attack Brother        X2   Heart disease Brother    Hypertension Brother    Heart attack Brother    Heart disease Brother    Allergies  Allergen Reactions   Ambien [Zolpidem] Other (See Comments)    Sleep walk, eat, and drive.   Zolpidem Other (See Comments)    Sleep walks   Zyprexa [Olanzapine] Itching and Swelling    Causes throat to swell   Zyprexa [Olanzapine] Hives   Current Medications:   Current Outpatient Medications:    amLODipine (NORVASC) 10 MG tablet, Take 1 tablet (10 mg total) by mouth daily., Disp: 90 tablet, Rfl: 3   atorvastatin (LIPITOR) 10 MG tablet, Take 1 tablet (10 mg total) by mouth daily., Disp: 90 tablet, Rfl: 3   DULoxetine (CYMBALTA) 60 MG capsule, Take 1 capsule (  60 mg total) by mouth daily., Disp: 90 capsule, Rfl: 3   losartan-hydrochlorothiazide (HYZAAR) 100-25 MG tablet, Take 1 tablet by mouth daily., Disp: 90 tablet, Rfl: 3   Multiple Vitamin (MULTIVITAMIN WITH MINERALS) TABS tablet, Take 1 tablet by mouth every morning., Disp: , Rfl:   Review of Systems:   ROS See pertinent positives and negatives as per the HPI.  Vitals:   Vitals:   05/14/23 1514  BP: 136/78  Pulse: 69  Temp: 98.9 F (37.2 C)  TempSrc: Temporal  SpO2: 98%  Weight: 206 lb 6.1 oz (93.6 kg)  Height: 5\' 3"  (1.6 m)     Body mass index is 36.56 kg/m.  Physical Exam:   Physical Exam Vitals and nursing note reviewed.  Constitutional:      General: She is not in acute distress.    Appearance: She is well-developed. She is not ill-appearing or toxic-appearing.  Cardiovascular:     Rate and Rhythm: Normal rate and regular rhythm.     Pulses: Normal pulses.     Heart sounds: Normal heart sounds, S1 normal and S2 normal.  Pulmonary:     Effort: Pulmonary effort is  normal.     Breath sounds: Normal breath sounds.  Musculoskeletal:     Comments: Generalized tenderness to palpation to bilateral shoulders/trapezius region  Skin:    General: Skin is warm and dry.  Neurological:     Mental Status: She is alert.     GCS: GCS eye subscore is 4. GCS verbal subscore is 5. GCS motor subscore is 6.  Psychiatric:        Speech: Speech normal.        Behavior: Behavior normal. Behavior is cooperative.     Assessment and Plan:   Fibromyalgia Per patient -- normal fibro flare Depo-medrol injection provided today Start oral prednisone 40 mg in am tomorrow x 5 days Take with food and avoid NSAIDs Follow-up if new/worsening symptom(s)   I,Emily Lagle,acting as a scribe for [employer], PA.,have documented all relevant documentation on the behalf of Jarold Motto, PA,as directed by  Jarold Motto, PA while in the presence of Jarold Motto, [place].  I, Jarold Motto, [place], have reviewed all documentation for this visit. The documentation on 05/14/23 for the exam, diagnosis, procedures, and orders are all accurate and complete.  Jarold Motto, PA-C

## 2023-05-14 NOTE — Patient Instructions (Addendum)
It was great to see you!  Steroid shot today   Start oral prednisone tomorrow -- take 40 mg prednisone in AM Take with food  HOLD ALL NSAIDS (such as aleve/ibuprofen/motrin) while on prednisone  If new/worsening symptom(s), please reach out as soon as possible   Take care,  Jarold Motto PA-C

## 2023-05-31 ENCOUNTER — Ambulatory Visit (INDEPENDENT_AMBULATORY_CARE_PROVIDER_SITE_OTHER): Payer: Medicare Other | Admitting: Internal Medicine

## 2023-05-31 ENCOUNTER — Encounter: Payer: Self-pay | Admitting: Internal Medicine

## 2023-05-31 ENCOUNTER — Telehealth: Payer: Self-pay

## 2023-05-31 VITALS — BP 156/78 | HR 65 | Temp 98.0°F | Resp 16 | Ht 63.0 in | Wt 209.0 lb

## 2023-05-31 DIAGNOSIS — I10 Essential (primary) hypertension: Secondary | ICD-10-CM

## 2023-05-31 DIAGNOSIS — R739 Hyperglycemia, unspecified: Secondary | ICD-10-CM | POA: Insufficient documentation

## 2023-05-31 DIAGNOSIS — N732 Unspecified parametritis and pelvic cellulitis: Secondary | ICD-10-CM | POA: Insufficient documentation

## 2023-05-31 DIAGNOSIS — R1900 Intra-abdominal and pelvic swelling, mass and lump, unspecified site: Secondary | ICD-10-CM | POA: Diagnosis not present

## 2023-05-31 LAB — HEPATIC FUNCTION PANEL
ALT: 23 U/L (ref 0–35)
AST: 18 U/L (ref 0–37)
Albumin: 4.4 g/dL (ref 3.5–5.2)
Alkaline Phosphatase: 61 U/L (ref 39–117)
Bilirubin, Direct: 0.1 mg/dL (ref 0.0–0.3)
Total Bilirubin: 0.5 mg/dL (ref 0.2–1.2)
Total Protein: 7.3 g/dL (ref 6.0–8.3)

## 2023-05-31 LAB — CBC WITH DIFFERENTIAL/PLATELET
Basophils Absolute: 0 10*3/uL (ref 0.0–0.1)
Basophils Relative: 0.5 % (ref 0.0–3.0)
Eosinophils Absolute: 0.1 10*3/uL (ref 0.0–0.7)
Eosinophils Relative: 1.7 % (ref 0.0–5.0)
HCT: 39.5 % (ref 36.0–46.0)
Hemoglobin: 13.2 g/dL (ref 12.0–15.0)
Lymphocytes Relative: 24.3 % (ref 12.0–46.0)
Lymphs Abs: 1.9 10*3/uL (ref 0.7–4.0)
MCHC: 33.5 g/dL (ref 30.0–36.0)
MCV: 91.3 fL (ref 78.0–100.0)
Monocytes Absolute: 0.5 10*3/uL (ref 0.1–1.0)
Monocytes Relative: 6.2 % (ref 3.0–12.0)
Neutro Abs: 5.2 10*3/uL (ref 1.4–7.7)
Neutrophils Relative %: 67.3 % (ref 43.0–77.0)
Platelets: 237 10*3/uL (ref 150.0–400.0)
RBC: 4.32 Mil/uL (ref 3.87–5.11)
RDW: 15.4 % (ref 11.5–15.5)
WBC: 7.7 10*3/uL (ref 4.0–10.5)

## 2023-05-31 LAB — C-REACTIVE PROTEIN: CRP: <1 mg/dL (ref 0.5–20.0)

## 2023-05-31 LAB — BASIC METABOLIC PANEL
BUN: 12 mg/dL (ref 6–23)
CO2: 29 meq/L (ref 19–32)
Calcium: 9.1 mg/dL (ref 8.4–10.5)
Chloride: 105 meq/L (ref 96–112)
Creatinine, Ser: 0.83 mg/dL (ref 0.40–1.20)
GFR: 72.69 mL/min (ref >60.00)
Glucose, Bld: 96 mg/dL (ref 70–99)
Potassium: 4.5 meq/L (ref 3.5–5.1)
Sodium: 142 meq/L (ref 135–145)

## 2023-05-31 LAB — HEMOGLOBIN A1C: Hgb A1c MFr Bld: 5.7 % (ref 4.6–6.5)

## 2023-05-31 MED ORDER — PROMETHAZINE HCL 12.5 MG PO TABS: 12.5000 mg | ORAL_TABLET | Freq: Four times a day (QID) | ORAL | 0 refills | Status: DC | PRN

## 2023-05-31 MED ORDER — CIPROFLOXACIN HCL 500 MG PO TABS
500.0000 mg | ORAL_TABLET | Freq: Two times a day (BID) | ORAL | 0 refills | Status: AC
Start: 2023-05-31 — End: 2023-06-07

## 2023-05-31 MED ORDER — METRONIDAZOLE 500 MG PO TABS: 500.0000 mg | ORAL_TABLET | Freq: Three times a day (TID) | ORAL | 0 refills | Status: AC

## 2023-05-31 NOTE — Telephone Encounter (Signed)
Contacted pt and scheduled for a 2pm @ [employer] location

## 2023-05-31 NOTE — Telephone Encounter (Signed)
Patient called doesn't have GYN but has 2 internal boils in vagina and needs to know if she could get a suppository of some sort to help. These have been present since Wednesday, very painful for patient. Not hard like a cyst, can't see it but feel it. Pt willing to come anytime today if PCP thinks she should be seen. Please call work number [phone]

## 2023-05-31 NOTE — Progress Notes (Addendum)
Subjective:  Patient ID: Alison Cline, female    DOB: 28-Oct-1955  Age: 67 y.o. MRN: [id number]  CC: Hypertension   HPI Alison Cline presents for f/up ---   Discussed the use of AI scribe software for clinical note transcription with the patient, who gave verbal consent to proceed.  History of Present Illness   The patient presented with a painful swelling in the right vagina for 3 days that has slowly worsened. The patient described the sensation as an internal irritation, likening it to a boil. There was no reported bleeding or discharge from the area, and the swelling was localized internally, with no overall swelling of the groin area. The patient denied any associated urinary or bowel symptoms.  The patient had been on a high dose of prednisone for a fibromyalgia flare a couple of weeks prior to the onset of the current symptoms. The patient speculated that the prednisone might have affected their immunity, potentially contributing to the current issue.       Outpatient Medications Prior to Visit  Medication Sig Dispense Refill   amLODipine (NORVASC) 10 MG tablet Take 1 tablet (10 mg total) by mouth daily. 90 tablet 3   atorvastatin (LIPITOR) 10 MG tablet Take 1 tablet (10 mg total) by mouth daily. 90 tablet 3   DULoxetine (CYMBALTA) 60 MG capsule Take 1 capsule (60 mg total) by mouth daily. 90 capsule 3   losartan-hydrochlorothiazide (HYZAAR) 100-25 MG tablet Take 1 tablet by mouth daily. 90 tablet 3   Multiple Vitamin (MULTIVITAMIN WITH MINERALS) TABS tablet Take 1 tablet by mouth every morning.     predniSONE (DELTASONE) 20 MG tablet Take 2 tablets (40 mg total) by mouth daily with breakfast. 10 tablet 0   No facility-administered medications prior to visit.    ROS Review of Systems  Constitutional:  Negative for appetite change, chills, diaphoresis, fatigue and fever.  HENT: Negative.    Eyes: Negative.   Respiratory: Negative.  Negative for chest tightness,  shortness of breath and wheezing.   Cardiovascular:  Negative for chest pain, palpitations and leg swelling.  Gastrointestinal: Negative.  Negative for abdominal pain, constipation, diarrhea, nausea and vomiting.  Genitourinary:  Positive for pelvic pain and vaginal pain. Negative for decreased urine volume, difficulty urinating, dyspareunia, dysuria, flank pain, frequency, genital sores, hematuria, menstrual problem, urgency and vaginal bleeding.  Musculoskeletal: Negative.   Skin: Negative.  Negative for rash.  Neurological:  Positive for headaches. Negative for dizziness, speech difficulty and weakness.  Hematological:  Negative for adenopathy. Does not bruise/bleed easily.  Psychiatric/Behavioral: Negative.      Objective:  BP (!) 156/78 (BP Location: Left Arm, Patient Position: Sitting, Cuff Size: Large)   Pulse 65   Temp 98 F (36.7 C) (Oral)   Resp 16   Ht 5\' 3"  (1.6 m)   Wt 209 lb (94.8 kg)   SpO2 97%   BMI 37.02 kg/m   BP Readings from Last 3 Encounters:  05/31/23 (!) 156/78  05/14/23 136/78  03/05/23 128/68    Wt Readings from Last 3 Encounters:  05/31/23 209 lb (94.8 kg)  05/14/23 206 lb 6.1 oz (93.6 kg)  03/05/23 206 lb 3.2 oz (93.5 kg)    Physical Exam Vitals reviewed. Exam conducted with a chaperone present [facility]).  Constitutional:      General: She is not in acute distress.    Appearance: Normal appearance. She is not ill-appearing, toxic-appearing or diaphoretic.  HENT:     Mouth/Throat:  Mouth: Mucous membranes are moist.  Eyes:     General: No scleral icterus.    Conjunctiva/sclera: Conjunctivae normal.  Cardiovascular:     Rate and Rhythm: Normal rate and regular rhythm.     Heart sounds: No murmur heard.    No gallop.  Pulmonary:     Effort: Pulmonary effort is normal.     Breath sounds: No stridor. No wheezing, rhonchi or rales.  Abdominal:     Palpations: There is no mass.     Tenderness: There is no abdominal tenderness. There is no  guarding.     Hernia: No hernia is present. There is no hernia in the left inguinal area or right inguinal area.  Genitourinary:    Exam position: Supine.     Pubic Area: No rash or pubic lice.      Labia:        Right: No rash, tenderness, lesion or injury.        Left: No rash, tenderness, lesion or injury.      Urethra: No prolapse, urethral pain, urethral swelling or urethral lesion.     Comments: External vagina is normal. There is no external abscess or swelling of the bartholin's glands. Inside the right side of the vagina there is an area of swelling and tenderness that extends over the symphysis pubis. Musculoskeletal:        General: Normal range of motion.     Cervical back: Neck supple.     Right lower leg: No edema.     Left lower leg: No edema.  Lymphadenopathy:     Cervical: No cervical adenopathy.     Lower Body: No right inguinal adenopathy. No left inguinal adenopathy.  Skin:    General: Skin is warm and dry.     Findings: No rash.  Neurological:     General: No focal deficit present.     Mental Status: She is alert. Mental status is at baseline.  Psychiatric:        Mood and Affect: Mood normal.        Behavior: Behavior normal.     Lab Results  Component Value Date   WBC 7.7 05/31/2023   HGB 13.2 05/31/2023   HCT 39.5 05/31/2023   PLT 237.0 05/31/2023   GLUCOSE 96 05/31/2023   CHOL 204 (H) 03/05/2023   TRIG 129.0 03/05/2023   HDL 58.80 03/05/2023   LDLDIRECT 154.0 08/30/2022   LDLCALC 120 (H) 03/05/2023   ALT 23 05/31/2023   AST 18 05/31/2023   NA 142 05/31/2023   K 4.5 05/31/2023   CL 105 05/31/2023   CREATININE 0.83 05/31/2023   BUN 12 05/31/2023   CO2 29 05/31/2023   TSH 3.12 08/30/2022   HGBA1C 5.7 05/31/2023    MR KNEE RIGHT WO CONTRAST Result Date: 10/01/2022 CLINICAL DATA:  Right knee pain EXAM: MRI OF THE RIGHT KNEE WITHOUT CONTRAST TECHNIQUE: Multiplanar, multisequence MR imaging of the knee was performed. No intravenous contrast was  administered. COMPARISON:  Right knee radiograph 08/30/2022 FINDINGS: MENISCI Medial: There is a radial tear at the posterior root of the medial meniscus with mild extrusion. Lateral: Possible small nondisplaced horizontal tear of the lateral meniscal body. LIGAMENTS Cruciates: ACL and PCL are intact. Collaterals: Mild periligamentous edema along the medial collateral ligament, which is otherwise intact. Lateral collateral ligament complex is intact. CARTILAGE Patellofemoral: Mild to moderate chondrosis most prominent along the upper aspect of the patellar facets and along the central trochlea. Medial: High-grade and full-thickness  cartilage loss along the weight-bearing surfaces with underlying subchondral marrow edema, most intense in the medial tibial plateau. Lateral:  Low-grade chondrosis. JOINT: Small joint effusion. POPLITEAL FOSSA: Moderate-sized Baker's cyst with evidence of cyst rupture. EXTENSOR MECHANISM: Intact quadriceps tendon. Intact patellar tendon. BONES: Probable developing subchondral insufficiency fracture of the medial tibial plateau. Intense associated marrow edema. Other: No focal fluid collection. Extensive soft tissue swelling along the popliteal fossa. IMPRESSION: Tricompartment osteoarthritis, worst in the medial compartment with high-grade and full-thickness cartilage loss along the weight-bearing surfaces and probable developing subchondral insufficiency fracture of the medial tibial plateau with intense associated marrow edema. Radial tear at the posterior root of the medial meniscus with mild extrusion. Possible small nondisplaced horizontal tear of the lateral meniscal body. Mild periligamentous edema along the MCL, can be seen in grade 1 MCL sprain. Moderate-sized Baker's cyst with evidence of cyst rupture. Electronically Signed   By: Caprice Renshaw M.D.   On: 10/01/2022 10:13    Assessment & Plan:  Pelvic cellulitis, female- Will treat with cipro and flagyl. MRI with contrast  ordered to evaluate for abscess, malignancy, mass. -     Ciprofloxacin HCl; Take 1 tablet (500 mg total) by mouth 2 (two) times daily for 7 days.  Dispense: 14 tablet; Refill: 0 -     metroNIDAZOLE; Take 1 tablet (500 mg total) by mouth 3 (three) times daily for 7 days.  Dispense: 21 tablet; Refill: 0 -     MR PELVIS W WO CONTRAST; Future -     CBC with Differential/Platelet; Future -     Basic metabolic panel; Future -     Hepatic function panel; Future -     C-reactive protein; Future -     Promethazine HCl; Take 1 tablet (12.5 mg total) by mouth every 6 (six) hours as needed for nausea or vomiting.  Dispense: 30 tablet; Refill: 0  Pelvic mass in female -     MR PELVIS W WO CONTRAST; Future  Essential hypertension -     CBC with Differential/Platelet; Future -     Basic metabolic panel; Future -     Hepatic function panel; Future  Chronic hyperglycemia -     Hemoglobin A1c; Future     Follow-up: Return in about 3 weeks (around 06/21/2023).  Sanda Linger, MD

## 2023-05-31 NOTE — Patient Instructions (Signed)
 Pelvic Pain, Female Pelvic pain is pain in your lower abdomen, below your belly button and between your hips. The pain may start suddenly (be acute), keep coming back (be recurring), or last a long time (become chronic). Pelvic pain that lasts longer than 6 months is considered chronic. Pelvic pain may affect your: Reproductive organs. Urinary system. Digestive tract. Musculoskeletal system. There are many potential causes of pelvic pain. Sometimes, the pain can be a result of digestive or urinary conditions, strained muscles or ligaments, or reproductive conditions. Sometimes the cause of pelvic pain is not known. Follow these instructions at home:  Take over-the-counter and prescription medicines only as told by your health care provider. Rest as told by your health care provider. Do not have sex if it hurts. Keep a journal of your pelvic pain. Write down: When the pain started. Where the pain is located. What seems to make the pain better or worse, such as food or your monthly period (menstrual cycle). Any symptoms you have along with the pain. Keep all follow-up visits. This is important. Contact a health care provider if: Medicine does not help your pain, or your pain comes back. You have new symptoms. You have abnormal vaginal discharge or bleeding, including bleeding after menopause. You have a fever or chills. You are constipated. You have blood in your urine or stool (feces). You have foul-smelling urine. You feel weak or light-headed. Get help right away if: You have sudden severe pain. Your pain gets steadily worse. You have severe pain along with fever, nausea, vomiting, or excessive sweating. You lose consciousness. These symptoms may represent a serious problem that is an emergency. Do not wait to see if the symptoms will go away. Get medical help right away. Call your local emergency services (911 in the [place] Do not drive yourself to the hospital. Summary Pelvic  pain is pain in your lower abdomen, below your belly button and between your hips. There are many potential causes of pelvic pain. Keep a journal of your pelvic pain. This information is not intended to replace advice given to you by your health care provider. Make sure you discuss any questions you have with your health care provider. Document Revised: 10/11/2020 Document Reviewed: 10/11/2020 Elsevier Patient Education  2024 [employer].

## 2023-06-03 ENCOUNTER — Ambulatory Visit
Admission: RE | Admit: 2023-06-03 | Discharge: 2023-06-03 | Disposition: A | Discharge status: Home / Self Care | Payer: Medicare Other | Source: Ambulatory Visit | Attending: Internal Medicine | Admitting: Internal Medicine

## 2023-06-03 DIAGNOSIS — R1900 Intra-abdominal and pelvic swelling, mass and lump, unspecified site: Secondary | ICD-10-CM

## 2023-06-03 DIAGNOSIS — K8020 Calculus of gallbladder without cholecystitis without obstruction: Secondary | ICD-10-CM | POA: Diagnosis not present

## 2023-06-03 DIAGNOSIS — N732 Unspecified parametritis and pelvic cellulitis: Secondary | ICD-10-CM

## 2023-06-03 DIAGNOSIS — K5730 Diverticulosis of large intestine without perforation or abscess without bleeding: Secondary | ICD-10-CM | POA: Diagnosis not present

## 2023-06-03 MED ORDER — GADOPICLENOL 0.5 MMOL/ML IV SOLN
10.0000 mL | Freq: Once | INTRAVENOUS | Status: AC | PRN
  Administered 2023-06-03: 10 mL via INTRAVENOUS

## 2023-06-04 ENCOUNTER — Encounter: Payer: Self-pay | Admitting: Internal Medicine

## 2023-06-04 ENCOUNTER — Other Ambulatory Visit: Payer: Self-pay | Admitting: Internal Medicine

## 2023-06-04 DIAGNOSIS — D251 Intramural leiomyoma of uterus: Secondary | ICD-10-CM | POA: Insufficient documentation

## 2023-06-04 DIAGNOSIS — D281 Benign neoplasm of vagina: Secondary | ICD-10-CM | POA: Insufficient documentation

## 2023-06-21 ENCOUNTER — Ambulatory Visit (INDEPENDENT_AMBULATORY_CARE_PROVIDER_SITE_OTHER): Payer: Medicare Other | Admitting: Physician Assistant

## 2023-06-21 ENCOUNTER — Encounter: Payer: Self-pay | Admitting: Physician Assistant

## 2023-06-21 VITALS — BP 130/74 | HR 82 | Temp 97.6°F | Ht 63.0 in | Wt 208.0 lb

## 2023-06-21 DIAGNOSIS — E782 Mixed hyperlipidemia: Secondary | ICD-10-CM

## 2023-06-21 DIAGNOSIS — M797 Fibromyalgia: Secondary | ICD-10-CM | POA: Diagnosis not present

## 2023-06-21 DIAGNOSIS — N950 Postmenopausal bleeding: Secondary | ICD-10-CM

## 2023-06-21 DIAGNOSIS — R102 Pelvic and perineal pain: Secondary | ICD-10-CM

## 2023-06-21 MED ORDER — CYCLOBENZAPRINE HCL 10 MG PO TABS: 10.0000 mg | ORAL_TABLET | Freq: Every evening | ORAL | 0 refills | Status: DC | PRN

## 2023-06-21 NOTE — Progress Notes (Signed)
 Patient ID: [name], female    DOB: March 15, 1956, 68 y.o.   MRN: [id number]   Assessment & Plan:  Postmenopausal bleeding -     US  PELVIC COMPLETE WITH TRANSVAGINAL; Future  Pelvic pain -     US  PELVIC COMPLETE WITH TRANSVAGINAL; Future  Fibromyalgia -     Cyclobenzaprine  HCl; Take 1 tablet (10 mg total) by mouth at bedtime as needed for muscle spasms.  Dispense: 30 tablet; Refill: 0  Mixed hyperlipidemia    Assessment and Plan    Fibromyalgia Recent severe flare managed with prednisone . Currently on maximum dose of Cymbalta . Increased frequency of flares. -Consider adding Flexeril  at bedtime for muscle relaxation. -Encourage swimming and other non-impact exercises. -Consider potential impact of Lipitor on muscle pain and fatigue. Suggest trial discontinuation if symptoms persist.  Pelvic Pain and Intervaginal Bleeding Recent episode of pelvic pain and bleeding. Pain resolved after antibiotics. MRI showed multiple small fibroids in the uterus. Intermittent spotting for the past year. -Order transvaginal ultrasound to assess uterine lining thickness and rule out uterine cancer. -Continue planned referral to gynecologist.  Hyperlipidemia On Lipitor. Recent discussion about potential muscle side effects. -Monitor for muscle pain and fatigue. Consider trial discontinuation if symptoms persist.  General Health Maintenance -Schedule colonoscopy. Last one approximately 8-10 years ago. -Continue current work and walking routine. -Encourage intake of supplements including Vitamin D , magnesium, calcium , and fish oil. -Follow-up in March with fasting labs.          No follow-ups on file.    Subjective:    Chief Complaint  Patient presents with   Medical Management of Chronic Issues    Pt in office for 3 wk f/u w/ PCP; pt states she is doing ok; not bleeding all the time; seen provider for fibroid issue and wants to discuss MRI today    HPI Discussed the use of  AI scribe software for clinical note transcription with the patient, who gave verbal consent to proceed.  History of Present Illness   The patient, with a history of fibromyalgia, presents for follow-up after a recent severe fibroflare. The flare was the worst she had experienced in years, lasting longer than her typical flares which usually resolve within five days to two weeks. The flare was successfully managed with a prednisone  regimen, which was a new treatment approach for the patient.  In addition to the fibromyalgia, the patient recently underwent an MRI for pelvic pain. The MRI revealed multiple small fibroids, diverticulosis, and gallstones. The patient reports feeling a mass-like structure in her vagina, which has since resolved. She was treated with antibiotics for a suspected pelvic cellulitis. The patient also reports intermittent vaginal bleeding, similar to heavy spotting during a period, for the past year. This bleeding is not consistent and can be absent for months at a time.  The patient is currently on Cymbalta  for fibromyalgia management and Lipitor for cholesterol management. She reports an increase in fibroflares and is seeking additional treatment options.       Past Medical History:  Diagnosis Date   Arthritis    C5 & C6   Bipolar disorder (HCC)    Diabetes mellitus without complication (HCC)    Diabetes mellitus without complication (HCC)    Borderline on metformin, DC's for the last year   Disc herniation    DVT (deep venous thrombosis) (HCC)    Hypertension    Hypokalemia    Thyroid  disease    Hypothyroid    Past Surgical  History:  Procedure Laterality Date   CESAREAN SECTION     X3   CESAREAN SECTION     x3   KNEE ARTHROSCOPY Left    KNEE SURGERY Left     Family History  Problem Relation Age of Onset   Cancer Mother        lukemia   Hypertension Mother    Other Mother        varicose veins   Cancer Father        prostate   Cancer Sister         colon   Cancer Brother        renal   Heart attack Brother        X2   Heart disease Brother    Hypertension Brother    Heart attack Brother    Heart disease Brother     Social History   Tobacco Use   Smoking status: Never   Smokeless tobacco: Never  Vaping Use   Vaping status: Never Used  Substance Use Topics   Alcohol use: Not Currently    Comment: stopped using in 12/2011   Drug use: No     Allergies  Allergen Reactions   Ambien [Zolpidem] Other (See Comments)    Sleep walk, eat, and drive.   Zolpidem Other (See Comments)    Sleep walks   Zyprexa [Olanzapine] Itching and Swelling    Causes throat to swell   Zyprexa [Olanzapine] Hives    Review of Systems NEGATIVE UNLESS OTHERWISE INDICATED IN HPI      Objective:     BP 130/74 (BP Location: Left Arm, Patient Position: Sitting, Cuff Size: Large)   Pulse 82   Temp 97.6 F (36.4 C) (Temporal)   Ht 5' 3 (1.6 m)   Wt 208 lb (94.3 kg)   SpO2 95%   BMI 36.85 kg/m   Wt Readings from Last 3 Encounters:  06/21/23 208 lb (94.3 kg)  05/31/23 209 lb (94.8 kg)  05/14/23 206 lb 6.1 oz (93.6 kg)    BP Readings from Last 3 Encounters:  06/21/23 130/74  05/31/23 (!) 156/78  05/14/23 136/78     Physical Exam Vitals and nursing note reviewed.  Constitutional:      Appearance: Normal appearance. She is obese.  Eyes:     Extraocular Movements: Extraocular movements intact.     Conjunctiva/sclera: Conjunctivae normal.     Pupils: Pupils are equal, round, and reactive to light.  Cardiovascular:     Rate and Rhythm: Normal rate and regular rhythm.     Pulses: Normal pulses.     Heart sounds: No murmur heard. Pulmonary:     Effort: Pulmonary effort is normal.     Breath sounds: Normal breath sounds.  Neurological:     General: No focal deficit present.     Mental Status: She is alert and oriented to person, place, and time.  Psychiatric:        Mood and Affect: Mood normal.            Lance Galas M  Algenis Ballin, PA-C

## 2023-06-21 NOTE — Patient Instructions (Addendum)
 VISIT SUMMARY:  During today's visit, we discussed your recent severe fibromyalgia flare, pelvic pain, and intermittent vaginal bleeding. We also reviewed your current medications and general health maintenance.  YOUR PLAN:  -FIBROMYALGIA: Fibromyalgia is a condition characterized by widespread muscle pain and fatigue. Your recent severe flare was managed with prednisone , and we discussed adding Flexeril  at bedtime to help with muscle relaxation. We also recommend swimming and other non-impact exercises. Since Lipitor can sometimes cause muscle pain, we may consider a trial discontinuation if your symptoms persist.  Talked with pt about treatment options -  Conservative measures -including acupuncture, massage, swimming, sleep hygiene, mental health care, mindfulness, yoga  Medications that may be helpful -amitriptyline, nortriptyline, Lyrica , gabapentin, Savella, Cymbalta , muscle relaxers  Supplements that may help-calcium , magnesium, vitamin D , co-Q10, omega-3   -PELVIC PAIN AND INTERVAGINAL BLEEDING: Your recent pelvic pain and bleeding were addressed with antibiotics, and an MRI showed multiple small fibroids in your uterus. To further investigate, we will order a transvaginal ultrasound to assess your uterine lining and rule out uterine cancer. You should continue with the planned referral to a gynecologist.  -HYPERLIPIDEMIA: Hyperlipidemia is a condition where there are high levels of fats in your blood. You are currently on Lipitor, and we discussed monitoring for muscle pain and fatigue. If these symptoms persist, we may consider a trial discontinuation of Lipitor.  -GENERAL HEALTH MAINTENANCE: We recommend scheduling a colonoscopy since your last one was approximately 8-10 years ago. Continue your current work and walking routine, and consider taking supplements including Vitamin D , magnesium, calcium , and fish oil. We will follow up in March with fasting labs.  INSTRUCTIONS:  Please  schedule a transvaginal ultrasound and a colonoscopy. Continue with your planned referral to a gynecologist. We will follow up in March with fasting labs.

## 2023-07-19 ENCOUNTER — Encounter: Payer: Self-pay | Admitting: Obstetrics and Gynecology

## 2023-07-19 ENCOUNTER — Other Ambulatory Visit: Payer: Self-pay | Admitting: Obstetrics and Gynecology

## 2023-07-19 ENCOUNTER — Ambulatory Visit: Payer: Medicare Other | Admitting: Obstetrics and Gynecology

## 2023-07-19 VITALS — BP 129/81 | HR 71 | Ht 63.0 in | Wt 207.2 lb

## 2023-07-19 DIAGNOSIS — N950 Postmenopausal bleeding: Secondary | ICD-10-CM

## 2023-07-19 DIAGNOSIS — D219 Benign neoplasm of connective and other soft tissue, unspecified: Secondary | ICD-10-CM

## 2023-07-19 NOTE — Progress Notes (Signed)
      GYNECOLOGY OFFICE PROCEDURE NOTE   Alison Cline is a 68 y.o. G3P3 here for endometrial biopsy for PMB.    ENDOMETRIAL BIOPSY     The indications for endometrial biopsy were reviewed.   Risks of the biopsy including cramping, bleeding, infection, uterine perforation, inadequate specimen and need for additional procedures were discussed. Offered alternative of hysteroscopy, dilation and curettage in OR. The patient states she understands the R/B/I/A and agrees to undergo procedure today. Urine pregnancy test was Not indicated. Consent was signed. Time out was performed.    Patient was positioned in dorsal lithotomy position. A vaginal speculum was placed.  The cervix was visualized and was prepped with Betadine. 3cc of lidocaine 1% were injected intracervically at 12 o'clock for anesthesia. A single-toothed tenaculum was placed on the anterior lip of the cervix to stabilize it. Os finder used to dilate cervix. The 3 mm pipelle was easily introduced into the endometrial cavity without difficulty to a depth of 8 cm, and a Moderate amount of tissue was obtained after two passes and sent to pathology. The instruments were removed from the patient's vagina. Minimal bleeding from the cervix was noted. The patient tolerated the procedure well.   Patient was given post procedure instructions.  Will follow up pathology and manage accordingly; patient will be contacted with results and recommendations.  Routine preventative health maintenance measures emphasized.   Harvie Bridge, MD Obstetrician & Gynecologist, [facility] for [employer], [facility]

## 2023-07-19 NOTE — Progress Notes (Signed)
Pt. Presents for fibroids.

## 2023-07-19 NOTE — Progress Notes (Signed)
   NEW GYNECOLOGY VISIT  Subjective:  Alison Cline is a 68 y.o. menopausal G3P3 presenting for postmenopausal bleeding and fibroids  Initially seen by PCP 12/13 with vulvovaginal swelling and tenderness to the symphysis pubis. Was treated with cipro/flagyl and MRI pelvis obtained w/ no soft tissue abnormality identified. Small IM fibroids seen but these were present on pelvic US in 2009. The swelling in the vagina/vulvar resolved, but she reported intermittent postmenopausal bleeding x 1 year. Pelvic US ordered but not yet obtained.  I personally reviewed the following: - PCP note 05/31/23  - PCP note 06/21/23 - notes pelvic pain/bleeding that resolved with abx; intermittent spotting x 1 year - Pelvic US 02/27/08  uterus with 2 IM fibroids, complex cyst in R adnexa - MR Pelvis 06/03/23 multiple small IM fibroids, normal ovaries, small nabothian cysts  History notable for HTN, BMI 36.   Objective:   Vitals:   07/19/23 0847  BP: 129/81  Pulse: 71  Weight: 207 lb 3.2 oz (94 kg)  Height: 5\' 3"  (1.6 m)   General:  Alert, oriented and cooperative. Patient is in no acute distress.  Skin: Skin is warm and dry. No rash noted.   Cardiovascular: Normal heart rate noted  Respiratory: Normal respiratory effort, no problems with respiration noted  Abdomen: Soft, non-tender, non-distended   Pelvic: NEFG. No vulvo/vaginal abnormality. Uterus normal size, mobile. No adnexal masses palpated.  1cm cervical polyp present at external os  Exam performed in the presence of a chaperone  Assessment and Plan:  Alison Cline is a 68 y.o. with postmenopausal bleeding  PMB (postmenopausal bleeding) - Discussed differential: atrophy, polyp, hyperplasia and malignancy. Cervical polyp seen on exam today and removed.  - Discussed option of EMB vs [place] and risks and benefits of each. May also have a D&C, hysteroscopy in the OR.  - Discussed possibility of no endometrial cells or insufficient sample on EMB  and in that event would recommend [place] or D&C/hysteroscopy for further evaluation. - Opted for EMB today; procedure uncomplicated - see separate proc note - Follow up is pending results -     US PELVIC COMPLETE WITH TRANSVAGINAL; Future -     Surgical pathology( [place]/ [facility]) -     Surgical pathology( [place]/ [facility])  Fibroids Discussed that these were visualized on imaging >10 years ago and do not need further follow up unless something changes  Return for pending biopsy & ultrasound results.  Future Appointments  Date Time Provider Department Center  07/24/2023 11:00 AM [facility]  09/03/2023  9:00 AM [place]ward[place], Crist Infante, PA-C [facility]   Lennart Pall, MD

## 2023-07-19 NOTE — Patient Instructions (Signed)
It is normal to have cramping and bleeding for the next 2-3 days. You should feel better every day.  Please call us if you have any severe pain, bleeding that soaks more than 1 pad in a hour, have fevers, or feel like you're going to pass out.  You can take tylenol '1000mg'$  every 8 hours and ibuprofen '800mg'$  every 8 hours as needed for pain. It is ok to take both at the same time.

## 2023-07-23 ENCOUNTER — Encounter: Payer: Self-pay | Admitting: Obstetrics and Gynecology

## 2023-07-23 LAB — SURGICAL PATHOLOGY

## 2023-07-24 ENCOUNTER — Encounter: Payer: Self-pay | Admitting: Obstetrics and Gynecology

## 2023-07-24 ENCOUNTER — Ambulatory Visit (HOSPITAL_BASED_OUTPATIENT_CLINIC_OR_DEPARTMENT_OTHER)
Admission: RE | Admit: 2023-07-24 | Discharge: 2023-07-24 | Disposition: A | Discharge status: Home / Self Care | Payer: Medicare Other | Source: Ambulatory Visit | Attending: Obstetrics and Gynecology | Admitting: Obstetrics and Gynecology

## 2023-07-24 DIAGNOSIS — N950 Postmenopausal bleeding: Secondary | ICD-10-CM | POA: Diagnosis present

## 2023-08-19 ENCOUNTER — Telehealth: Payer: Medicare Other | Admitting: Obstetrics and Gynecology

## 2023-08-19 DIAGNOSIS — R9389 Abnormal findings on diagnostic imaging of other specified body structures: Secondary | ICD-10-CM

## 2023-08-19 NOTE — Progress Notes (Unsigned)
 TELEHEALTH GYNECOLOGY VISIT ENCOUNTER NOTE  Provider location: [facility]   Patient location: Work  I connected with Alison Cline on 08/19/23 at  2:30 PM EST by MyChart A/V encounter   History:  Alison Cline is a 68 y.o. menopausal G3P3 presenting for follow up of PMB  Saw patient 07/19/23 for intermittent PMB x 1 year. 1cm cervical polyp was seen on exam and removed; path benign. EMB was also benign. Pelvic US 07/24/23 (after EMB) with 7.8 x 6.2 x 4cm uterus with 1.1cm homogenous EL, ovaries not visualized.   She presents today to review her results. She has not had any further bleeding.  She reports a normal recent Pap smear and will upload to my chart.    Past Medical History:  Diagnosis Date   Arthritis    C5 & C6   Bipolar disorder (HCC)    Diabetes mellitus without complication (HCC)    Diabetes mellitus without complication (HCC)    Borderline on metformin, DC's for the last year   Disc herniation    DVT (deep venous thrombosis) (HCC)    Hypertension    Hypokalemia    Thyroid disease    Hypothyroid   Past Surgical History:  Procedure Laterality Date   CESAREAN SECTION     X3   CESAREAN SECTION     x3   KNEE ARTHROSCOPY Left    KNEE SURGERY Left    The following portions of the patient's history were reviewed and updated as appropriate: allergies, current medications, past family history, past medical history, past social history, past surgical history and problem list.   Review of Systems:  Pertinent items noted in HPI and remainder of comprehensive ROS otherwise negative.  Physical Exam:   General:  Alert, oriented and cooperative.   Mental Status: Normal mood and affect perceived. Normal judgment and thought content.  Physical exam deferred due to nature of the encounter  Labs and Imaging No results found for this or any previous visit (from the past 2 weeks). US PELVIC COMPLETE WITH TRANSVAGINAL Result Date:  07/24/2023 : PROCEDURE: US PELVIS COMPLETE WITH TRANSVAGINAL HISTORY: Patient is a 68 y/o F with postmenopausal bleeding intermittently for one year. Endometrial ablation 07/19/2023, results still pending. COMPARISON: MR pelvis 06/03/2023, U/S pelvis 02/28/2008. TECHNIQUE: Two-dimensional transabdominal grayscale and color Doppler ultrasound of the pelvis was performed. Transvaginal was also performed. FINDINGS: The uterus is anteverted in position and measures 7.8 x 6.2 x 4.0 cm. It demonstrates a normal, homogeneous echotexture. The endometrium measures up to 1.1 cm and demonstrates a normal homogeneous echotexture. Multiple nabothian cysts are noted. The ovaries are obscured by overlying bowel gas. There is no fluid present within the cul-de-sac. IMPRESSION: 1. Thickened postmenopausal endometrium measuring up to 1.1 cm. Gyn consultation recommended. 2.  Nonvisualization of the ovaries. Thank you for allowing [place] to assist in the care of this patient. Electronically Signed   By: Lestine Box M.D.   On: 07/24/2023 14:07      Assessment and Plan:  68yo with postmenopausal bleeding s/p benign EMB & removal of benign endocervical polyp now with thickened endometrium on [place]  Thickened endometrium - While EMB and resolution of PMB is reassuring, we discussed that EL > 11mm corresponds to a 6.7% risk of endometrial cancer/pre cancer without bleeding.  - In light of this, I recommended hysteroscopy D&C for more thorough sampling of the uterus and assessment for endometrial polyps. Reviewed procedure including risks and anticipated recovery  of 24-48 hours - She declines and would rather monitor for now. Recommended pelvic US in 3-6 months and plan for hysteroscopy if persistent thickening of EL OR if recurrence of vaginal bleeding -     US PELVIC COMPLETE WITH TRANSVAGINAL; Future  I provided 15 minutes of non-face-to-face time during this encounter.  Lennart Pall, MD [facility]

## 2023-08-20 ENCOUNTER — Other Ambulatory Visit: Payer: Self-pay | Admitting: Physician Assistant

## 2023-08-20 DIAGNOSIS — M797 Fibromyalgia: Secondary | ICD-10-CM

## 2023-09-03 ENCOUNTER — Encounter: Payer: Self-pay | Admitting: Obstetrics and Gynecology

## 2023-09-03 ENCOUNTER — Encounter: Payer: Self-pay | Admitting: Physician Assistant

## 2023-09-03 ENCOUNTER — Ambulatory Visit: Payer: Medicare Other | Admitting: Physician Assistant

## 2023-09-03 VITALS — BP 116/74 | HR 75 | Temp 98.2°F | Ht 63.0 in | Wt 205.2 lb

## 2023-09-03 DIAGNOSIS — M19041 Primary osteoarthritis, right hand: Secondary | ICD-10-CM

## 2023-09-03 DIAGNOSIS — E782 Mixed hyperlipidemia: Secondary | ICD-10-CM

## 2023-09-03 DIAGNOSIS — D259 Leiomyoma of uterus, unspecified: Secondary | ICD-10-CM

## 2023-09-03 DIAGNOSIS — M19042 Primary osteoarthritis, left hand: Secondary | ICD-10-CM

## 2023-09-03 DIAGNOSIS — E1169 Type 2 diabetes mellitus with other specified complication: Secondary | ICD-10-CM | POA: Diagnosis not present

## 2023-09-03 LAB — POCT GLYCOSYLATED HEMOGLOBIN (HGB A1C): Hemoglobin A1C: 5.3 % (ref 4.0–5.6)

## 2023-09-03 MED ORDER — PREGABALIN 75 MG PO CAPS
75.0000 mg | ORAL_CAPSULE | Freq: Two times a day (BID) | ORAL | 0 refills | Status: DC
Start: 2023-09-03 — End: 2023-10-11

## 2023-09-03 NOTE — Patient Instructions (Signed)
 Please schedule AWV with [name]  Also schedule 6 month f/up with Huxley Shurley with fasting labs

## 2023-09-03 NOTE — Progress Notes (Signed)
 Patient ID: Alison Cline, female    DOB: February 27, 1956, 68 y.o.   MRN: [id number]   Assessment & Plan:  Type 2 diabetes mellitus with other specified complication, without long-term current use of insulin (HCC) -     Microalbumin / creatinine urine ratio -     POCT glycosylated hemoglobin (Hb A1C)  Other orders -     Pregabalin; Take 1 capsule (75 mg total) by mouth 2 (two) times daily.  Dispense: 60 capsule; Refill: 0     Assessment and Plan    Fibromyalgia Chronic pain in neck, shoulders, and right arm. Previous gabapentin ineffective. Current regimen includes Flexeril. Discussed Lyrica for pain relief and potential side effects. - Start Lyrica 75 mg twice a day. - Monitor for side effects such as brain fog, drowsiness, and excessive tiredness. - Send MyChart message in a few weeks to report on Lyrica's effectiveness.  Osteoarthritis Arthritis in neck and possibly big toe, with Heberden's nodes. No rheumatoid or autoimmune arthritis. Family history present. - Consider referral to rheumatology if symptoms worsen.  Hyperlipidemia Previously stopped statin therapy without pain improvement. A1c well-controlled at 5.3. - Monitor lipid levels during next blood work. Pt does not want to start back on statin at this time.   Uterine Fibroids Previous fibroid removal. Another fibroid may be present. D&C recommended if bleeding recurs.  - Monitor for recurrence of bleeding. - Follow up with gynecology if bleeding recurs.  General Health Maintenance Upcoming wellness check and need for fasting labs. - Schedule annual wellness visit with [name]. - Schedule six-month follow-up with [name] including fasting labs.         Subjective:    Chief Complaint  Patient presents with   Medical Management of Chronic Issues    Pt in office for 6 mon f/u; last A1C was 5.7 in Dec; pt states things have been going well; still battling with Fibromyalgia. Stopped taking Statin to see if it would  help muscle pain but not telling a difference. Will schedule AWV when checking out today;     HPI Discussed the use of AI scribe software for clinical note transcription with the patient, who gave verbal consent to proceed.  History of Present Illness   Alison Cline is a 68 year old female with fibromyalgia and osteoarthritis who presents with chronic pain management.  She experiences persistent pain primarily in her neck and shoulders, with radiation down her arms, especially the right arm, during severe episodes. The pain is typically rated as a 'five' daily, escalating to a 'ten' during flares. She has a history of arthritis in her neck, confirmed by MRI, and was treated by a chiropractor until the past year. No numbness, tingling, or weakness in her arms. She uses Flexeril, which aids in sleep and muscle relaxation. Previously, she used gabapentin up to 3600 mg but has not tried Lyrica or Cymbalta for her fibromyalgia.  She has a family history of arthritis, with her mother also affected. She notes arthritis in her big toe and fingers, with signs of Heberden's nodes. She denies any other autoimmune conditions.  She underwent a gynecological procedure to remove a fibroid, which was thought to be causing bleeding. Another fibroid may be present, but she opted to wait for further symptoms before pursuing additional intervention.   She is in the process of obtaining records for a colonoscopy, which has been delayed due to difficulty accessing prior records.       Past Medical History:  Diagnosis Date   Arthritis    C5 & C6   Bipolar disorder (HCC)    Diabetes mellitus without complication (HCC)    Diabetes mellitus without complication (HCC)    Borderline on metformin, DC's for the last year   Disc herniation    DVT (deep venous thrombosis) (HCC)    Hypertension    Hypokalemia    Thyroid disease    Hypothyroid    Past Surgical History:  Procedure Laterality Date   CESAREAN  SECTION     X3   CESAREAN SECTION     x3   KNEE ARTHROSCOPY Left    KNEE SURGERY Left     Family History  Problem Relation Age of Onset   Cancer Mother        lukemia   Hypertension Mother    Other Mother        varicose veins   Cancer Father        prostate   Cancer Sister        colon   Cancer Brother        renal   Heart attack Brother        X2   Heart disease Brother    Hypertension Brother    Heart attack Brother    Heart disease Brother     Social History   Tobacco Use   Smoking status: Never   Smokeless tobacco: Never  Vaping Use   Vaping status: Never Used  Substance Use Topics   Alcohol use: Not Currently    Comment: stopped using in 12/2011   Drug use: No     Allergies  Allergen Reactions   Ambien [Zolpidem] Other (See Comments)    Sleep walk, eat, and drive.   Zolpidem Other (See Comments)    Sleep walks   Zyprexa [Olanzapine] Itching and Swelling    Causes throat to swell   Zyprexa [Olanzapine] Hives    Review of Systems NEGATIVE UNLESS OTHERWISE INDICATED IN HPI      Objective:     BP 116/74 (BP Location: Left Arm, Patient Position: Sitting, Cuff Size: Large)   Pulse 75   Temp 98.2 F (36.8 C) (Temporal)   Ht 5\' 3"  (1.6 m)   Wt 205 lb 3.2 oz (93.1 kg)   SpO2 99%   BMI 36.35 kg/m   Wt Readings from Last 3 Encounters:  09/03/23 205 lb 3.2 oz (93.1 kg)  07/19/23 207 lb 3.2 oz (94 kg)  06/21/23 208 lb (94.3 kg)    BP Readings from Last 3 Encounters:  09/03/23 116/74  07/19/23 129/81  06/21/23 130/74     Physical Exam Vitals and nursing note reviewed.  Constitutional:      Appearance: Normal appearance. She is obese.  Eyes:     Extraocular Movements: Extraocular movements intact.     Conjunctiva/sclera: Conjunctivae normal.     Pupils: Pupils are equal, round, and reactive to light.  Cardiovascular:     Rate and Rhythm: Normal rate and regular rhythm.     Pulses: Normal pulses.     Heart sounds: No murmur  heard. Pulmonary:     Effort: Pulmonary effort is normal.     Breath sounds: Normal breath sounds.  Musculoskeletal:     Comments: Few herbeden's nodules noted on hands  Neurological:     General: No focal deficit present.     Mental Status: She is alert and oriented to person, place, and time.  Psychiatric:  Mood and Affect: Mood normal.          Noriel Guthrie M Jeriah Corkum, PA-C

## 2023-09-04 IMAGING — CR DG CHEST 2V
2 series · 2 of 2 positions shown · non-contrast
Comparison: 07/25/2017

CLINICAL DATA: Persistent cough

EXAM:
CHEST - 2 VIEW

[w chest pa]
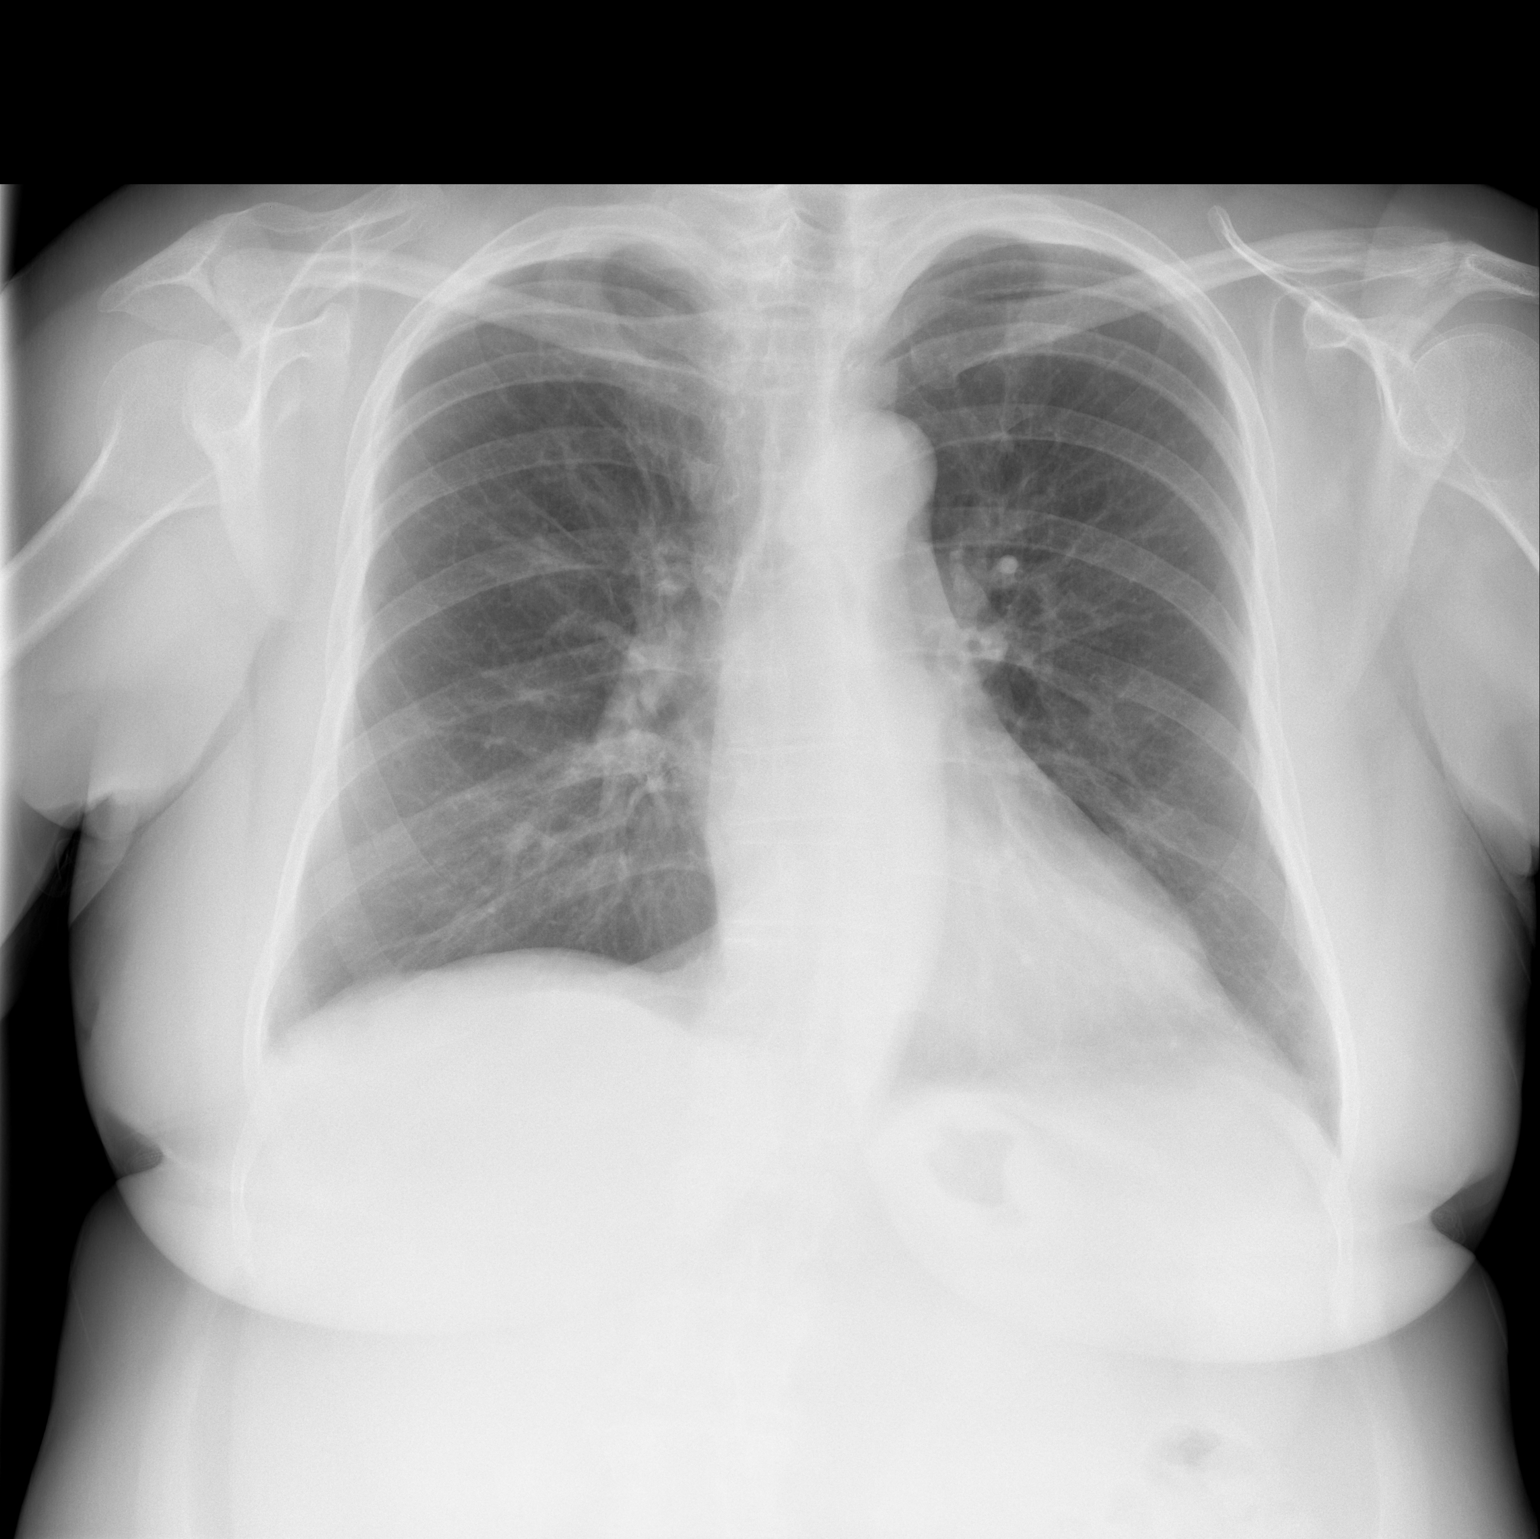

[w chest lat]
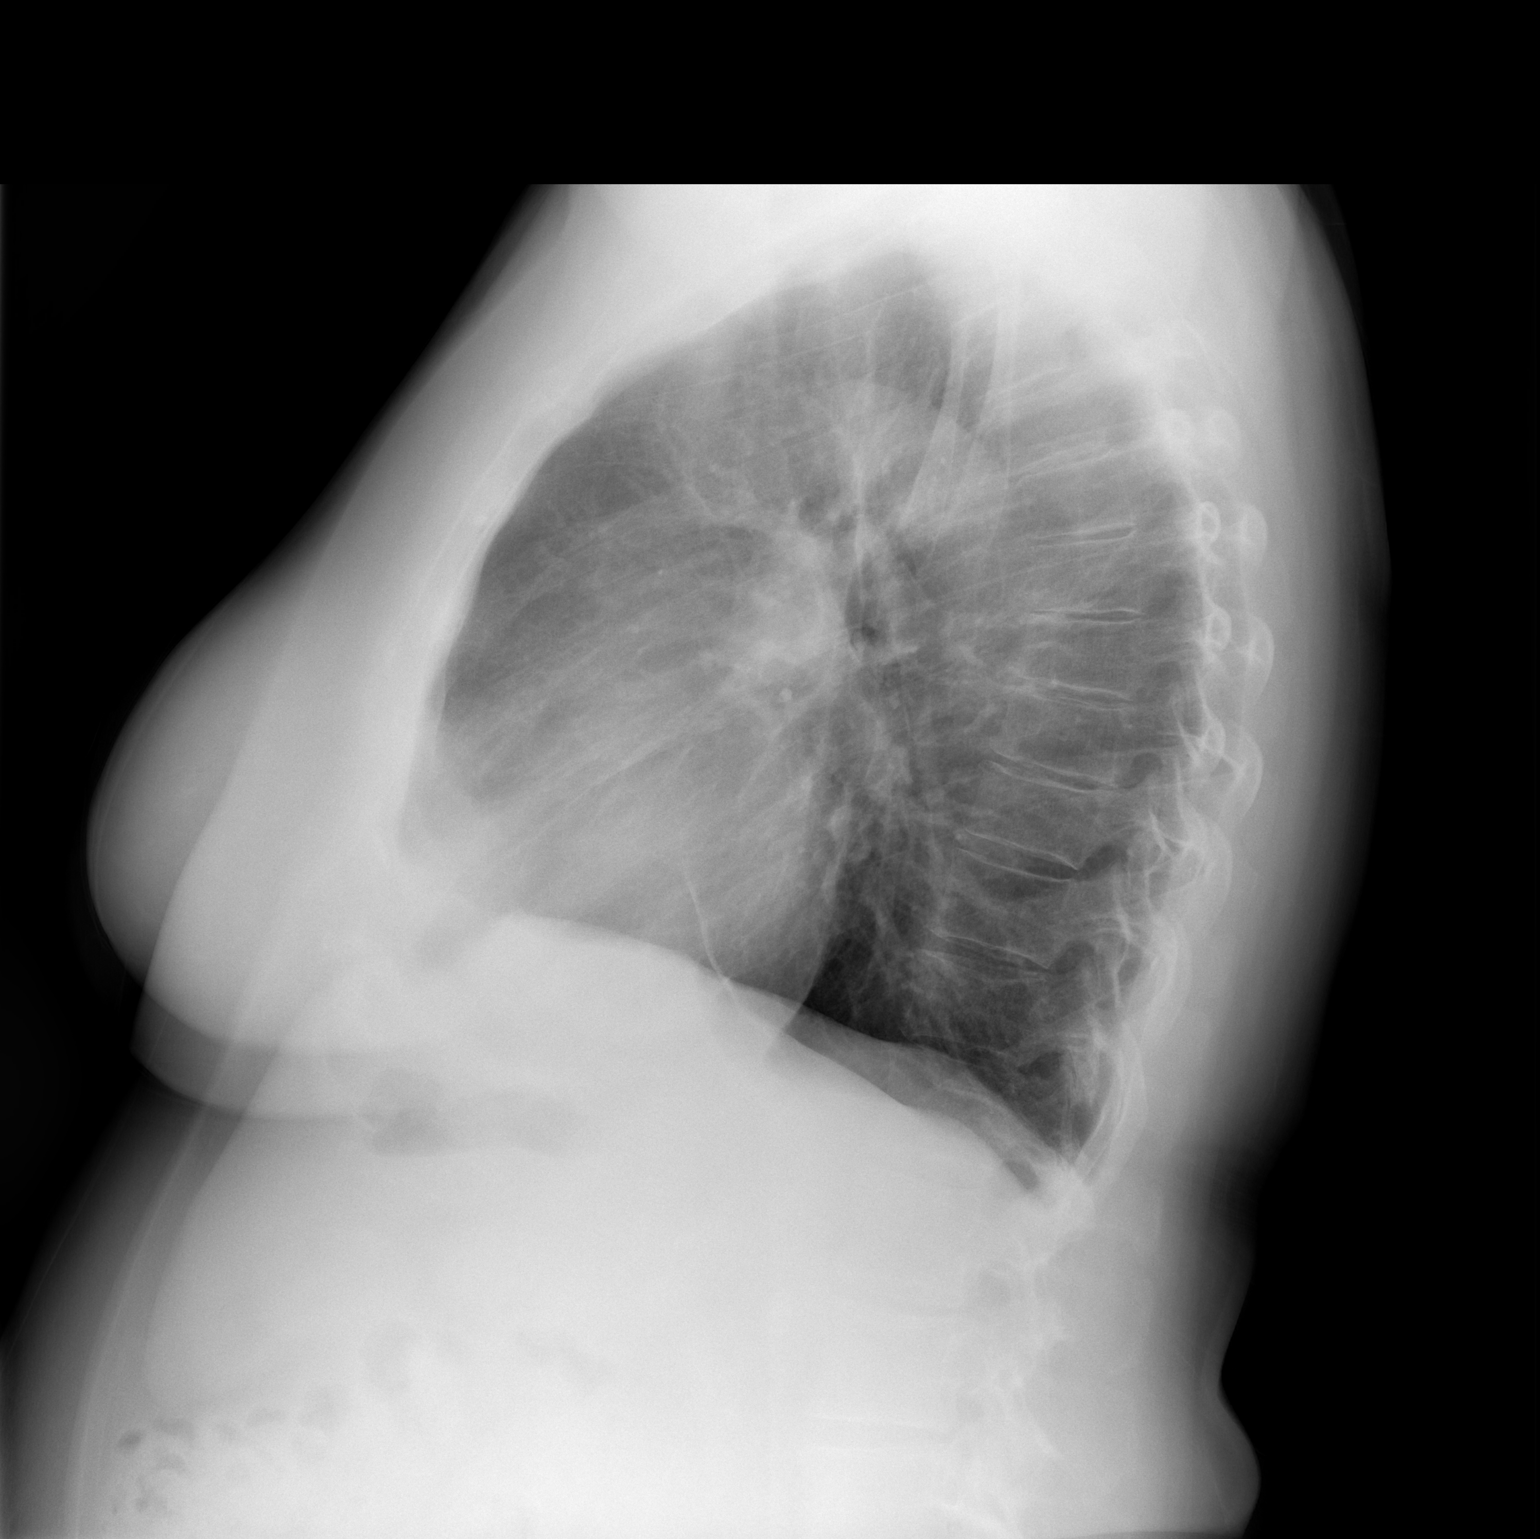

[2 of 2 positions shown; findings below may reference images not displayed]

FINDINGS: The heart size and mediastinal contours are within normal limits.
Both lungs are clear. The visualized skeletal structures are
unremarkable. Minimal scarring at the lung bases on lateral view.
IMPRESSION: No active cardiopulmonary disease.

## 2023-09-09 ENCOUNTER — Other Ambulatory Visit: Payer: Self-pay

## 2023-09-09 ENCOUNTER — Encounter: Payer: Self-pay | Admitting: Obstetrics and Gynecology

## 2023-09-09 MED ORDER — NITROFURANTOIN MONOHYD MACRO 100 MG PO CAPS: 100.0000 mg | ORAL_CAPSULE | Freq: Two times a day (BID) | ORAL | 0 refills | Status: DC

## 2023-09-09 NOTE — Progress Notes (Signed)
 RX sent for UTI, pt unable to come for nurse visit due to work, informed pt if symptoms are not getting better must come in for urine sample

## 2023-09-13 LAB — HM DIABETES EYE EXAM

## 2023-09-17 ENCOUNTER — Encounter: Payer: Self-pay | Admitting: Physician Assistant

## 2023-09-17 NOTE — Telephone Encounter (Signed)
 Please see patient message. Pt claims she is unable to schedule Colonoscopy due to no records from previously completed colonoscopy. Pt has no recollection of where last one was completed. Can we get any help to get this scheduled for her?

## 2023-09-18 ENCOUNTER — Telehealth: Payer: Self-pay | Admitting: Internal Medicine

## 2023-09-18 NOTE — Telephone Encounter (Signed)
 Called and LM on vmail for patient to schedule an OV.  Per Lady Gary she cannot locate her last colonoscopy report so therefore schedule an OV to discuss.

## 2023-09-23 ENCOUNTER — Encounter: Payer: Self-pay | Admitting: Gastroenterology

## 2023-09-23 NOTE — Telephone Encounter (Signed)
 Any update for patient or information in regards to scheduling

## 2023-09-23 NOTE — Telephone Encounter (Signed)
Please see pt update as FYI 

## 2023-09-24 ENCOUNTER — Ambulatory Visit

## 2023-09-24 VITALS — BP 141/82 | HR 66

## 2023-09-24 DIAGNOSIS — R300 Dysuria: Secondary | ICD-10-CM

## 2023-09-24 LAB — POCT URINALYSIS DIPSTICK
Bilirubin, UA: NEGATIVE
Blood, UA: NEGATIVE
Glucose, UA: NEGATIVE
Ketones, UA: NEGATIVE
Leukocytes, UA: NEGATIVE
Nitrite, UA: POSITIVE
Protein, UA: NEGATIVE
Spec Grav, UA: <=1.005 — AB (ref 1.010–1.025)
Urobilinogen, UA: 0.2 U/dL
pH, UA: 5.5 (ref 5.0–8.0)

## 2023-09-24 NOTE — Progress Notes (Signed)
 SUBJECTIVE: Alison Cline is a 68 y.o. female who complains of urinary frequency, urgency and dysuria x 7 days, without flank pain, fever, chills, or abnormal vaginal discharge or bleeding.   OBJECTIVE: Appears well, in no apparent distress.  Vital signs are normal. Urine dipstick shows positive for nitrates.    ASSESSMENT: Dysuria  PLAN: Treatment per orders.  Call or return to clinic prn if these symptoms worsen or fail to improve as anticipated.

## 2023-09-27 LAB — URINE CULTURE

## 2023-09-30 ENCOUNTER — Other Ambulatory Visit: Payer: Self-pay | Admitting: Obstetrics and Gynecology

## 2023-09-30 ENCOUNTER — Other Ambulatory Visit: Payer: Self-pay | Admitting: Physician Assistant

## 2023-09-30 DIAGNOSIS — N3000 Acute cystitis without hematuria: Secondary | ICD-10-CM

## 2023-09-30 DIAGNOSIS — M797 Fibromyalgia: Secondary | ICD-10-CM

## 2023-09-30 MED ORDER — NITROFURANTOIN MONOHYD MACRO 100 MG PO CAPS: 100.0000 mg | ORAL_CAPSULE | Freq: Two times a day (BID) | ORAL | 0 refills | Status: DC

## 2023-10-10 ENCOUNTER — Other Ambulatory Visit: Payer: Self-pay | Admitting: Physician Assistant

## 2023-10-10 NOTE — Telephone Encounter (Signed)
 Last OV: 09/03/23  Next OV: 10/17/23  Last Filled: 09/03/23  Quantity: 60

## 2023-10-17 ENCOUNTER — Encounter: Payer: Self-pay | Admitting: Physician Assistant

## 2023-10-17 ENCOUNTER — Ambulatory Visit (INDEPENDENT_AMBULATORY_CARE_PROVIDER_SITE_OTHER): Admitting: Physician Assistant

## 2023-10-17 VITALS — BP 116/72 | HR 70 | Temp 98.1°F | Ht 63.0 in | Wt 210.2 lb

## 2023-10-17 DIAGNOSIS — M25562 Pain in left knee: Secondary | ICD-10-CM

## 2023-10-17 NOTE — Progress Notes (Signed)
 Patient ID: Alison Cline, female    DOB: [birth year], 68 y.o.   MRN: [id number]   Assessment & Plan:  Acute pain of left knee     Assessment & Plan Left knee pain with possible effusion Left knee pain for two weeks, gradually worsening, exacerbated by movement and relieved by standing still. No recent injury.  - Refer to [facility] for imaging (x-ray or ultrasound) and possible intervention (cortisone injection). - Advise against further self-administration of ibuprofen due to potential overuse.  Right knee injury with prior cortisone injection Minor tear identified on MRI in April 2024, successfully managed with cortisone injection, no further issues reported.      No follow-ups on file.    Subjective:    Chief Complaint  Patient presents with   Knee Pain    Pt in office c/o left knee pain; pt first noticed two weeks ago when getting in and out her car for work; pt states is progressively getting worse; sitting and walking irritates it more; standing still doesn't seem to cause irritation;     Knee Pain    Discussed the use of AI scribe software for clinical note transcription with the patient, who gave verbal consent to proceed.  History of Present Illness Alison Cline is a 68 year old female who presents with left knee pain.  Left knee pain began approximately two weeks ago and has progressively worsened. The pain is described as a 'zipping' or 'zapping' sensation, particularly when extending the leg while getting out of the car. Standing still relieves the pain, but activities such as walking, sitting, or sleeping with the knee bent exacerbate it.  There is no history of recent injury to the left knee, but frequent car exits for work may have aggravated the condition. She has a history of a cortisone injection in the right knee following an MRI that showed a minor tear, which was effective in managing her symptoms at that time. She recalls a  similar surgery on the left knee in the early 1990s following a fall.  She has been taking ibuprofen regularly for the past two weeks to manage the pain. The left knee feels swollen compared to the right, with a sensation of fluid under the kneecap, and is described as 'squishy' when pressed.     Past Medical History:  Diagnosis Date   Arthritis    C5 & C6   Bipolar disorder (HCC)    Diabetes mellitus without complication (HCC)    Diabetes mellitus without complication (HCC)    Borderline on metformin, DC's for the last year   Disc herniation    DVT (deep venous thrombosis) (HCC)    Hypertension    Hypokalemia    Thyroid  disease    Hypothyroid    Past Surgical History:  Procedure Laterality Date   CESAREAN SECTION     X3   CESAREAN SECTION     x3   KNEE ARTHROSCOPY Left    KNEE SURGERY Left     Family History  Problem Relation Age of Onset   Cancer Mother        lukemia   Hypertension Mother    Other Mother        varicose veins   Cancer Father        prostate   Cancer Sister        colon   Cancer Brother        renal   Heart attack Brother  X2   Heart disease Brother    Hypertension Brother    Heart attack Brother    Heart disease Brother     Social History   Tobacco Use   Smoking status: Never   Smokeless tobacco: Never  Vaping Use   Vaping status: Never Used  Substance Use Topics   Alcohol use: Not Currently    Comment: stopped using in 12/2011   Drug use: No     Allergies  Allergen Reactions   Ambien [Zolpidem] Other (See Comments)    Sleep walk, eat, and drive.   Zolpidem Other (See Comments)    Sleep walks   Zyprexa [Olanzapine] Itching and Swelling    Causes throat to swell   Zyprexa [Olanzapine] Hives    Review of Systems NEGATIVE UNLESS OTHERWISE INDICATED IN HPI      Objective:     BP 116/72 (BP Location: Left Arm, Patient Position: Sitting, Cuff Size: Large)   Pulse 70   Temp 98.1 F (36.7 C) (Temporal)   Ht 5'  3" (1.6 m)   Wt 210 lb 3.2 oz (95.3 kg)   SpO2 98%   BMI 37.24 kg/m   Wt Readings from Last 3 Encounters:  10/17/23 210 lb 3.2 oz (95.3 kg)  09/03/23 205 lb 3.2 oz (93.1 kg)  07/19/23 207 lb 3.2 oz (94 kg)    BP Readings from Last 3 Encounters:  10/17/23 116/72  09/24/23 (!) 141/82  09/03/23 116/74     Physical Exam Musculoskeletal:     Left knee: Swelling present. No erythema, ecchymosis or bony tenderness. Normal range of motion. Tenderness present over the medial joint line.             Whitnee Orzel M Bereket Gernert, PA-C

## 2023-10-18 ENCOUNTER — Ambulatory Visit (HOSPITAL_BASED_OUTPATIENT_CLINIC_OR_DEPARTMENT_OTHER)

## 2023-10-18 ENCOUNTER — Ambulatory Visit (HOSPITAL_BASED_OUTPATIENT_CLINIC_OR_DEPARTMENT_OTHER): Admitting: Student

## 2023-10-18 ENCOUNTER — Encounter (HOSPITAL_BASED_OUTPATIENT_CLINIC_OR_DEPARTMENT_OTHER): Payer: Self-pay | Admitting: Student

## 2023-10-18 DIAGNOSIS — M1712 Unilateral primary osteoarthritis, left knee: Secondary | ICD-10-CM | POA: Diagnosis not present

## 2023-10-18 DIAGNOSIS — M25562 Pain in left knee: Secondary | ICD-10-CM

## 2023-10-18 DIAGNOSIS — M25462 Effusion, left knee: Secondary | ICD-10-CM | POA: Diagnosis not present

## 2023-10-18 MED ORDER — TRIAMCINOLONE ACETONIDE 40 MG/ML IJ SUSP
2.0000 mL | INTRAMUSCULAR | Status: AC | PRN
  Administered 2023-10-18: 2 mL via INTRA_ARTICULAR

## 2023-10-18 MED ORDER — LIDOCAINE HCL 1 % IJ SOLN
4.0000 mL | INTRAMUSCULAR | Status: AC | PRN
  Administered 2023-10-18: 4 mL

## 2023-10-18 NOTE — Progress Notes (Signed)
 Chief Complaint: Left knee pain    Discussed the use of AI scribe software for clinical note transcription with the patient, who gave verbal consent to proceed.  History of Present Illness Alison Cline is a 68 year old female who presents with worsening left knee pain. The left knee pain began approximately two and a half weeks ago and is worsened by bending. It is not alleviated by ibuprofen or ice. The pain is located on both the medial and lateral aspects, with the medial side being more painful. There is no buckling or giving out, and the posterior aspect is not painful. She had a previous injury to the left knee in 1999, which required arthroscopic surgery. Since then, she has not experienced significant problems until the recent onset of pain. She has not received any injections in the left knee. The pain causes significant discomfort while sleeping. Walking does not significantly worsen the pain compared to other activities.  She does have a known meniscus tear within the right knee and has managed this with a recent cortisone injection.    Surgical History:   Left knee arthroscopy 1999  PMH/PSH/Family History/Social History/Meds/Allergies:    Past Medical History:  Diagnosis Date   Arthritis    C5 & C6   Bipolar disorder (HCC)    Diabetes mellitus without complication (HCC)    Diabetes mellitus without complication (HCC)    Borderline on metformin, DC's for the last year   Disc herniation    DVT (deep venous thrombosis) (HCC)    Hypertension    Hypokalemia    Thyroid  disease    Hypothyroid   Past Surgical History:  Procedure Laterality Date   CESAREAN SECTION     X3   CESAREAN SECTION     x3   KNEE ARTHROSCOPY Left    KNEE SURGERY Left    Social History   Socioeconomic History   Marital status: Widowed    Spouse name: Not on file   Number of children: Not on file   Years of education: Not on file   Highest education level:  Not on file  Occupational History   Not on file  Tobacco Use   Smoking status: Never   Smokeless tobacco: Never  Vaping Use   Vaping status: Never Used  Substance and Sexual Activity   Alcohol use: Not Currently    Comment: stopped using in 12/2011   Drug use: No   Sexual activity: Yes    Birth control/protection: Surgical    Comment: BTL  Other Topics Concern   Not on file  Social History Narrative   ** Merged History Encounter **       Social Drivers of Health   Financial Resource Strain: Low Risk  (10/11/2022)   Overall Financial Resource Strain (CARDIA)    Difficulty of Paying Living Expenses: Not hard at all  Food Insecurity: No Food Insecurity (10/11/2022)   Hunger Vital Sign    Worried About Running Out of Food in the Last Year: Never true    Ran Out of Food in the Last Year: Never true  Transportation Needs: No Transportation Needs (10/11/2022)   PRAPARE - Administrator, Civil Service (Medical): No    Lack of Transportation (Non-Medical): No  Physical Activity: Inactive (10/11/2022)   Exercise Vital Sign  Days of Exercise per Week: 0 days    Minutes of Exercise per Session: 0 min  Stress: No Stress Concern Present (10/11/2022)   [employer] of Occupational Health - Occupational Stress Questionnaire    Feeling of Stress : Only a little  Social Connections: Moderately Isolated (10/11/2022)   Social Connection and Isolation Panel [NHANES]    Frequency of Communication with Friends and Family: More than three times a week    Frequency of Social Gatherings with Friends and Family: More than three times a week    Attends Religious Services: More than 4 times per year    Active Member of [employer] or Organizations: No    Attends Banker Meetings: Never    Marital Status: Widowed   Family History  Problem Relation Age of Onset   Cancer Mother        lukemia   Hypertension Mother    Other Mother        varicose veins   Cancer Father         prostate   Cancer Sister        colon   Cancer Brother        renal   Heart attack Brother        X2   Heart disease Brother    Hypertension Brother    Heart attack Brother    Heart disease Brother    Allergies  Allergen Reactions   Ambien [Zolpidem] Other (See Comments)    Sleep walk, eat, and drive.   Zolpidem Other (See Comments)    Sleep walks   Zyprexa [Olanzapine] Itching and Swelling    Causes throat to swell   Zyprexa [Olanzapine] Hives   Current Outpatient Medications  Medication Sig Dispense Refill   amLODipine  (NORVASC ) 10 MG tablet Take 1 tablet (10 mg total) by mouth daily. 90 tablet 3   atorvastatin  (LIPITOR) 10 MG tablet Take 1 tablet (10 mg total) by mouth daily. (Patient not taking: Reported on 10/17/2023) 90 tablet 3   cyclobenzaprine  (FLEXERIL ) 10 MG tablet TAKE 1 TABLET BY MOUTH AT BEDTIME AS NEEDED FOR MUSCLE SPASMS 30 tablet 0   DULoxetine  (CYMBALTA ) 60 MG capsule Take 1 capsule (60 mg total) by mouth daily. 90 capsule 3   losartan -hydrochlorothiazide (HYZAAR) 100-25 MG tablet Take 1 tablet by mouth daily. 90 tablet 3   Multiple Vitamin (MULTIVITAMIN WITH MINERALS) TABS tablet Take 1 tablet by mouth every morning.     nitrofurantoin , macrocrystal-monohydrate, (MACROBID ) 100 MG capsule Take 1 capsule (100 mg total) by mouth 2 (two) times daily. 14 capsule 0   pregabalin  (LYRICA ) 75 MG capsule TAKE 1 CAPSULE BY MOUTH TWICE A DAY 60 capsule 0   promethazine  (PHENERGAN ) 12.5 MG tablet Take 1 tablet (12.5 mg total) by mouth every 6 (six) hours as needed for nausea or vomiting. 30 tablet 0   No current facility-administered medications for this visit.   No results found.  Review of Systems:   A ROS was performed including pertinent positives and negatives as documented in the HPI.  Physical Exam :   Constitutional: NAD and appears stated age Neurological: Alert and oriented Psych: Appropriate affect and cooperative There were no vitals taken for this  visit.   Comprehensive Musculoskeletal Exam:    Left knee exam demonstrates tenderness with palpation of the medial joint line.  Active range of motion from 0 to 120 degrees without crepitus.  Mild edema present within the knee without presence of effusion, confirmed under  ultrasound visualization.  Stable collateral ligaments with varus and valgus stress.  Imaging:   Xray (left knee 4 views): Well-maintained joint spacing within all 3 compartments without evidence of acute bony abnormality.   I personally reviewed and interpreted the radiographs.      Assessment & Plan Left knee pain   Pain is present medially slightly greater than laterally, unrelieved by ibuprofen and ice.  X-rays show no evidence of significant arthritis or bony abnormality.  No significant effusion visualized under ultrasound. Administer a cortisone injection to reduce inflammation and pain and monitor for relief. If symptoms persist or worsen, consider further imaging or alternative treatments, such as physical therapy versus MRI.     Procedure Note  Patient: Alison Cline             Date of Birth: [birth year]           MRN: [id number]             Visit Date: 10/18/2023  Procedures: Visit Diagnoses:  1. Acute pain of left knee     Large Joint Inj: L knee on 10/18/2023 12:33 PM Indications: pain Details: 22 G 1.5 in needle, anterolateral approach Medications: 4 mL lidocaine 1 %; 2 mL triamcinolone acetonide 40 MG/ML Outcome: tolerated well, no immediate complications Procedure, treatment alternatives, risks and benefits explained, specific risks discussed. Consent was given by the patient. Immediately prior to procedure a time out was called to verify the correct patient, procedure, equipment, support staff and site/side marked as required. Patient was prepped and draped in the usual sterile fashion.        I personally saw and evaluated the patient, and participated in the management and treatment  plan.  Sharrell Deck, PA-C Orthopedics

## 2023-10-22 ENCOUNTER — Encounter

## 2023-10-24 ENCOUNTER — Encounter (HOSPITAL_BASED_OUTPATIENT_CLINIC_OR_DEPARTMENT_OTHER): Payer: Self-pay

## 2023-10-30 ENCOUNTER — Telehealth: Payer: Self-pay

## 2023-10-30 ENCOUNTER — Other Ambulatory Visit: Payer: Self-pay

## 2023-10-30 ENCOUNTER — Encounter (HOSPITAL_BASED_OUTPATIENT_CLINIC_OR_DEPARTMENT_OTHER): Payer: Self-pay

## 2023-10-30 ENCOUNTER — Emergency Department (HOSPITAL_BASED_OUTPATIENT_CLINIC_OR_DEPARTMENT_OTHER)
Admission: EM | Admit: 2023-10-30 | Discharge: 2023-10-30 | Disposition: A | Discharge status: Home / Self Care | Attending: Emergency Medicine | Admitting: Emergency Medicine

## 2023-10-30 ENCOUNTER — Emergency Department (HOSPITAL_BASED_OUTPATIENT_CLINIC_OR_DEPARTMENT_OTHER): Admitting: Radiology

## 2023-10-30 DIAGNOSIS — R079 Chest pain, unspecified: Secondary | ICD-10-CM

## 2023-10-30 DIAGNOSIS — E876 Hypokalemia: Secondary | ICD-10-CM | POA: Insufficient documentation

## 2023-10-30 DIAGNOSIS — R350 Frequency of micturition: Secondary | ICD-10-CM

## 2023-10-30 DIAGNOSIS — E119 Type 2 diabetes mellitus without complications: Secondary | ICD-10-CM | POA: Insufficient documentation

## 2023-10-30 DIAGNOSIS — Z79899 Other long term (current) drug therapy: Secondary | ICD-10-CM | POA: Diagnosis not present

## 2023-10-30 DIAGNOSIS — I10 Essential (primary) hypertension: Secondary | ICD-10-CM | POA: Diagnosis not present

## 2023-10-30 DIAGNOSIS — R0789 Other chest pain: Secondary | ICD-10-CM | POA: Diagnosis not present

## 2023-10-30 LAB — COMPREHENSIVE METABOLIC PANEL WITH GFR
ALT: 27 U/L (ref 0–44)
AST: 25 U/L (ref 15–41)
Albumin: 4.2 g/dL (ref 3.5–5.0)
Alkaline Phosphatase: 54 U/L (ref 38–126)
Anion gap: 12 (ref 5–15)
BUN: 14 mg/dL (ref 8–23)
CO2: 26 mmol/L (ref 22–32)
Calcium: 9.6 mg/dL (ref 8.9–10.3)
Chloride: 103 mmol/L (ref 98–111)
Creatinine, Ser: 0.98 mg/dL (ref 0.44–1.00)
GFR, Estimated: >60 mL/min (ref >60)
Glucose, Bld: 99 mg/dL (ref 70–99)
Potassium: 3.4 mmol/L — ABNORMAL LOW (ref 3.5–5.1)
Sodium: 141 mmol/L (ref 135–145)
Total Bilirubin: 0.4 mg/dL (ref 0.0–1.2)
Total Protein: 6.7 g/dL (ref 6.5–8.1)

## 2023-10-30 LAB — URINALYSIS, ROUTINE W REFLEX MICROSCOPIC
Bilirubin Urine: NEGATIVE
Glucose, UA: NEGATIVE mg/dL
Ketones, ur: NEGATIVE mg/dL
Nitrite: NEGATIVE
Protein, ur: NEGATIVE mg/dL
Specific Gravity, Urine: 1.016 (ref 1.005–1.030)
pH: 7.5 (ref 5.0–8.0)

## 2023-10-30 LAB — CBC
HCT: 36.7 % (ref 36.0–46.0)
Hemoglobin: 12.5 g/dL (ref 12.0–15.0)
MCH: 29.7 pg (ref 26.0–34.0)
MCHC: 34.1 g/dL (ref 30.0–36.0)
MCV: 87.2 fL (ref 80.0–100.0)
Platelets: 202 10*3/uL (ref 150–400)
RBC: 4.21 MIL/uL (ref 3.87–5.11)
RDW: 14.6 % (ref 11.5–15.5)
WBC: 5.6 10*3/uL (ref 4.0–10.5)
nRBC: 0 % (ref 0.0–0.2)

## 2023-10-30 LAB — PRO BRAIN NATRIURETIC PEPTIDE: Pro Brain Natriuretic Peptide: 154 pg/mL (ref <300.0)

## 2023-10-30 LAB — TROPONIN T, HIGH SENSITIVITY
Troponin T High Sensitivity: <15 ng/L (ref <19)
Troponin T High Sensitivity: <15 ng/L (ref <19)

## 2023-10-30 MED ORDER — CEFADROXIL 500 MG PO CAPS: 500.0000 mg | ORAL_CAPSULE | Freq: Two times a day (BID) | ORAL | 0 refills | Status: DC

## 2023-10-30 NOTE — ED Provider Notes (Signed)
 [place] EMERGENCY DEPARTMENT AT [facility] Provider Note   CSN: [id number] Arrival date & time: 10/30/23  0957     History  Chief Complaint  Patient presents with   Chest Pain    Alison Cline is a 68 y.o. female.   Chest Pain   68 year old female presents emergency department with complaints of chest pain.  States that she was sitting in bed around midnight last night reading a book when she developed central chest pain described as dull, pressure.  States that she was having some radiation to her right jaw.  Symptoms lasted for a matter 15 minutes or so before spontaneously resolving.  States she has a history of symptoms similar around August 2023.  Went to the emergency department at that time and had a reassuring workup and was sent home diagnosed with anxiety.  Denies any exertional worsening of symptoms.  Denies any associated cough, shortness of breath, fever.  Does state that after this occurred, had an hour.  Or so but she had increased urinary frequency.  Patient does states that she noticed more lower extremity swelling yesterday evening and had her legs elevated in the bed when she was reading which has helped.  Reports family history MI in 2 of her brothers 1 who died at 33 and another at 71 secondary to MIs.  Patient states she has not seen a cardiologist for her symptoms.  Currently feels at baseline.  Past medical history significant for hypertension, hyperlipidemia, bipolar disorder, diabetes mellitus, DVT  Home Medications Prior to Admission medications   Medication Sig Start Date End Date Taking? Authorizing Provider  amLODipine  (NORVASC ) 10 MG tablet Take 1 tablet (10 mg total) by mouth daily. 03/05/23  Yes Allwardt, Alyssa M, PA-C  cyclobenzaprine  (FLEXERIL ) 10 MG tablet TAKE 1 TABLET BY MOUTH AT BEDTIME AS NEEDED FOR MUSCLE SPASMS 10/01/23  Yes Allwardt, Alyssa M, PA-C  DULoxetine  (CYMBALTA ) 60 MG capsule Take 1 capsule (60 mg total) by mouth daily.  03/05/23  Yes Allwardt, Alyssa M, PA-C  losartan -hydrochlorothiazide (HYZAAR) 100-25 MG tablet Take 1 tablet by mouth daily. 03/05/23  Yes Allwardt, Alyssa M, PA-C  Multiple Vitamin (MULTIVITAMIN WITH MINERALS) TABS tablet Take 1 tablet by mouth every morning.   Yes [provider]  pregabalin  (LYRICA ) 75 MG capsule TAKE 1 CAPSULE BY MOUTH TWICE A DAY 10/11/23  Yes Allwardt, Alyssa M, PA-C  atorvastatin  (LIPITOR) 10 MG tablet Take 1 tablet (10 mg total) by mouth daily. Patient not taking: Reported on 10/17/2023 03/05/23   Allwardt, Alyssa M, PA-C  nitrofurantoin , macrocrystal-monohydrate, (MACROBID ) 100 MG capsule Take 1 capsule (100 mg total) by mouth 2 (two) times daily. 09/30/23   Marci Setter, MD  promethazine  (PHENERGAN ) 12.5 MG tablet Take 1 tablet (12.5 mg total) by mouth every 6 (six) hours as needed for nausea or vomiting. 05/31/23   Arcadio Knuckles, MD      Allergies    Ambien [zolpidem], Zolpidem, Zyprexa [olanzapine], and Zyprexa [olanzapine]    Review of Systems   Review of Systems  Cardiovascular:  Positive for chest pain.  All other systems reviewed and are negative.   Physical Exam Updated Vital Signs BP (!) 148/81   Pulse 87   Temp 98.6 F (37 C) (Oral)   Resp 18   Ht 5\' 3"  (1.6 m)   Wt 90.7 kg   SpO2 95%   BMI 35.43 kg/m  Physical Exam Vitals and nursing note reviewed.  Constitutional:  General: She is not in acute distress.    Appearance: She is well-developed.  HENT:     Head: Normocephalic and atraumatic.  Eyes:     Conjunctiva/sclera: Conjunctivae normal.  Cardiovascular:     Rate and Rhythm: Normal rate and regular rhythm.     Pulses: Normal pulses.  Pulmonary:     Effort: Pulmonary effort is normal. No respiratory distress.     Breath sounds: Normal breath sounds. No wheezing, rhonchi or rales.  Chest:     Chest wall: No tenderness.  Abdominal:     Palpations: Abdomen is soft.     Tenderness: There is no abdominal tenderness.   Musculoskeletal:        General: No swelling.     Cervical back: Neck supple.  Skin:    General: Skin is warm and dry.     Capillary Refill: Capillary refill takes less than 2 seconds.  Neurological:     Mental Status: She is alert.  Psychiatric:        Mood and Affect: Mood normal.     ED Results / Procedures / Treatments   Labs (all labs ordered are listed, but only abnormal results are displayed) Labs Reviewed  COMPREHENSIVE METABOLIC PANEL WITH GFR  CBC  URINALYSIS, ROUTINE W REFLEX MICROSCOPIC  PRO BRAIN NATRIURETIC PEPTIDE  TROPONIN T, HIGH SENSITIVITY    EKG None  Radiology No results found.  Procedures Procedures    Medications Ordered in ED Medications - No data to display  ED Course/ Medical Decision Making/ A&P Clinical Course as of 10/30/23 1036  Wed Oct 30, 2023  1030 Brotherx2 34 and 60 mi [CR]    Clinical Course User Index [CR] Inez Butter, [place]                                 Medical Decision Making Amount and/or Complexity of Data Reviewed Labs: ordered. Radiology: ordered.  Risk Prescription drug management.   This patient presents to the ED for concern of chest pain, this involves an extensive number of treatment options, and is a complaint that carries with it a high risk of complications and morbidity.  The differential diagnosis includes musculoskeletal, ACS, PE, pneumothorax, Pericarditis/Myocarditis/tamponade, GERD, other    Co morbidities that complicate the patient evaluation  See HPI   Additional history obtained:  Additional history obtained from EMR External records from outside source obtained and reviewed including hospital records   Lab Tests:  I Ordered, and personally interpreted labs.  The pertinent results include: Mild hypokalemia otherwise, S within limits.  No transaminitis.  No renal dysfunction.  No leukocytosis.  No evidence of anemia.  Placed within range.  No troponin less than 15 with repeat  less than 15.  BNP of 154.  UA with many bacteria, 11-20 RBCs and moderate leukocytes.   Imaging Studies ordered:  I ordered imaging studies including chest x-ray I independently visualized and interpreted imaging which showed no acute pulmonary abnormality I agree with the radiologist interpretation   Cardiac Monitoring: / EKG:  The patient was maintained on a cardiac monitor.  I personally viewed and interpreted the cardiac monitored which showed an underlying rhythm of: Sinus rhythm with T wave inversion V1 V2.  No significant ischemic change from EKG performed 11/2005   Consultations Obtained:  See ED course  Problem List / ED Course / Critical interventions / Medication management  Chest pain Reevaluation of the  patient showed that the patient stayed the same I have reviewed the patients home medicines and have made adjustments as needed   Social Determinants of Health:  Denies tobacco,'s or drug use.   Test / Admission - Considered:  Chest pain Vitals signs significant for hypertension blood pressure 148/81. Otherwise within normal range and stable throughout visit. Laboratory/imaging studies significant for: See above 68 year old female presents emergency department with complaints of chest pain.  States that she was sitting in bed around midnight last night reading a book when she developed central chest pain described as dull, pressure.  States that she was having some radiation to her right jaw.  Symptoms lasted for a matter 15 minutes or so before spontaneously resolving.  States she has a history of symptoms similar around August 2023.  Went to the emergency department at that time and had a reassuring workup and was sent home diagnosed with anxiety.  Denies any exertional worsening of symptoms.  Denies any associated cough, shortness of breath, fever.  Does state that after this occurred, had an hour.  Or so but she had increased urinary frequency.  Patient does states  that she noticed more lower extremity swelling yesterday evening and had her legs elevated in the bed when she was reading which has helped.  Reports family history MI in 2 of her brothers 1 who died at 76 and another at 19 secondary to MIs.  Patient states she has not seen a cardiologist for her symptoms.  Currently feels at baseline. On exam, lungs clear to auscultation bilaterally.  No chest wall tenderness.  No abdominal tenderness.  Workup today reassuring.  Patient revealed negative troponin, lack of acute ischemic change on EKG; low suspicion for ACS.  Patient without tachycardia, tachypnea, hypoxia, pleuritic pain; low suspicion for PE.  Chest x-ray without obvious pneumonia, pneumothorax or other acute cardiopulmonary abnormality.  Patient without chest pain radiating to back, pulse deficits, neurodeficits, hypertension; low suspicion for aortic dissection.  Symptoms not exertional in nature and seem to occur at rest when she was sitting still last night.  Seem less likely cardiac in nature.  Given patient's risk factors as well as family history of MI, we will send referral for cardiology for further evaluation.  Patient was feeling increasingly anxious last night and feels like this could be attributing to her symptoms.  Regarding urinary frequency, UA concerning for possible UTI; will place patient on antibiotics for treatment of this.  Treatment plan discussed with patient and she acknowledged this and was agreeable to said plan.  Patient overall well-appearing, afebrile in no acute distress. Worrisome signs and symptoms were discussed with the patient, and the patient acknowledged understanding to return to the ED if noticed. Patient was stable upon discharge.          Final Clinical Impression(s) / ED Diagnoses Final diagnoses:  None    Rx / DC Orders ED Discharge Orders     None         [place] Butter, [place] 10/30/23 1512    Auston Blush, MD 11/05/23 1318

## 2023-10-30 NOTE — ED Triage Notes (Signed)
 In for eval of chest pain/tightness onset 0000 this am with jaw pain. Pain has resolved. Reports swelling to ankles and increased urination last night.

## 2023-10-30 NOTE — Discharge Instructions (Addendum)
 As discussed your workup today was overall reassuring.  Your heart enzyme was normal.  Your EKG appeared normal.  Your chest x-ray appeared normal.  Will send a referral for cardiology to follow-up with.  Your urine does show evidence of possible infection so placed on antibiotics for treatment of the UTI.  Please do not hesitate to return to emergency department if the worrisome signs and symptoms we discussed become apparent.

## 2023-10-30 NOTE — Telephone Encounter (Signed)
 Copied from CRM [id number]. Topic: Clinical - Medical Advice >> Oct 30, 2023  8:07 AM Alison Cline wrote: Reason for CRM: Pt inquiring if EKG's can be done at this location or if she will need a referral to get one done. She would like to schedule. Callback number is [phone].  Returned pt call, pt advised had episode last night in bed with chest heaviness and tightness of jaw, spoke with PCP and pt was advised to go to ED for evaluation. EKG does not show needed information for these symptoms, per PCP needs Troponin level checked. Pt advised going to [facility] for evaluation

## 2023-11-01 LAB — URINE CULTURE: Culture: 10000 — AB

## 2023-11-02 ENCOUNTER — Telehealth (HOSPITAL_BASED_OUTPATIENT_CLINIC_OR_DEPARTMENT_OTHER): Payer: Self-pay | Admitting: *Deleted

## 2023-11-02 NOTE — Telephone Encounter (Signed)
 Post ED Visit - Positive Culture Follow-up  Culture report reviewed by antimicrobial stewardship pharmacist: [facility] []  Court Distance, Pharm.D. []  Skeet Duke, Pharm.D., BCPS AQ-ID []  Leslee Rase, Pharm.D., BCPS []  Garland Junk, Pharm.D., BCPS []  [place], [place].D., BCPS, AAHIVP []  Alcide Aly, Pharm.D., BCPS, AAHIVP []  Jerri Morale, PharmD, BCPS []  Graham Laws, PharmD, BCPS []  Cleda Curly, PharmD, BCPS []  Tamar Fairly, PharmD []  Ballard Levels, PharmD, BCPS [x]  Mamie Searles, PharmD  [facility] []  Arlyne Bering, PharmD []  Sherryle Don, PharmD []  Van Gelinas, PharmD []  Delila Felty, Rph []  Luna [place]) Cleora Daft, PharmD []  Augustina Block, PharmD []  Arie Kurtz, PharmD []  Sharlyn Deaner, PharmD []  Agnes Hose, PharmD []  Kendall Pauls, PharmD []  Gladstone Lamer, PharmD []  Armanda Bern, PharmD []  Tera Fellows, PharmD   Positive urine culture Treated with Cefadroxil , organism sensitive to the same and no further patient follow-up is required at this time.  Alison Cline 11/02/2023, 11:49 AM

## 2023-11-06 ENCOUNTER — Encounter: Payer: Self-pay | Admitting: Physician Assistant

## 2023-11-07 ENCOUNTER — Other Ambulatory Visit: Payer: Self-pay | Admitting: Physician Assistant

## 2023-11-07 DIAGNOSIS — M797 Fibromyalgia: Secondary | ICD-10-CM

## 2023-11-07 NOTE — Telephone Encounter (Signed)
 Please see pt msg and advise if you would like to schedule an appointment to discuss with patient

## 2023-11-15 ENCOUNTER — Ambulatory Visit: Admitting: Physician Assistant

## 2023-11-15 DIAGNOSIS — M109 Gout, unspecified: Secondary | ICD-10-CM

## 2023-11-15 DIAGNOSIS — E1169 Type 2 diabetes mellitus with other specified complication: Secondary | ICD-10-CM

## 2023-11-19 ENCOUNTER — Ambulatory Visit: Payer: Self-pay | Admitting: Physician Assistant

## 2023-11-19 ENCOUNTER — Ambulatory Visit (INDEPENDENT_AMBULATORY_CARE_PROVIDER_SITE_OTHER): Admitting: Physician Assistant

## 2023-11-19 VITALS — BP 120/70 | HR 71 | Temp 98.2°F | Ht 63.0 in | Wt 206.8 lb

## 2023-11-19 DIAGNOSIS — G8929 Other chronic pain: Secondary | ICD-10-CM

## 2023-11-19 DIAGNOSIS — M797 Fibromyalgia: Secondary | ICD-10-CM

## 2023-11-19 DIAGNOSIS — Z8639 Personal history of other endocrine, nutritional and metabolic disease: Secondary | ICD-10-CM | POA: Diagnosis not present

## 2023-11-19 DIAGNOSIS — E782 Mixed hyperlipidemia: Secondary | ICD-10-CM | POA: Diagnosis not present

## 2023-11-19 DIAGNOSIS — M25562 Pain in left knee: Secondary | ICD-10-CM

## 2023-11-19 DIAGNOSIS — N3001 Acute cystitis with hematuria: Secondary | ICD-10-CM | POA: Diagnosis not present

## 2023-11-19 DIAGNOSIS — Z8249 Family history of ischemic heart disease and other diseases of the circulatory system: Secondary | ICD-10-CM

## 2023-11-19 DIAGNOSIS — R079 Chest pain, unspecified: Secondary | ICD-10-CM

## 2023-11-19 DIAGNOSIS — Z1211 Encounter for screening for malignant neoplasm of colon: Secondary | ICD-10-CM

## 2023-11-19 LAB — POC URINALSYSI DIPSTICK (AUTOMATED)
Bilirubin, UA: 1
Blood, UA: NEGATIVE
Glucose, UA: NEGATIVE
Ketones, UA: NEGATIVE
Nitrite, UA: NEGATIVE
Protein, UA: POSITIVE — AB
Spec Grav, UA: >=1.03 — AB (ref 1.010–1.025)
Urobilinogen, UA: 0.2 U/dL
pH, UA: 6 (ref 5.0–8.0)

## 2023-11-19 LAB — LIPID PANEL
Cholesterol: 223 mg/dL — ABNORMAL HIGH (ref 0–200)
HDL: 56 mg/dL (ref >39.00)
LDL Cholesterol: 125 mg/dL — ABNORMAL HIGH (ref 0–99)
NonHDL: 166.58
Total CHOL/HDL Ratio: 4
Triglycerides: 210 mg/dL — ABNORMAL HIGH (ref 0.0–149.0)
VLDL: 42 mg/dL — ABNORMAL HIGH (ref 0.0–40.0)

## 2023-11-19 LAB — URIC ACID: Uric Acid, Serum: 5.7 mg/dL (ref 2.4–7.0)

## 2023-11-19 LAB — MICROALBUMIN / CREATININE URINE RATIO
Creatinine,U: 272 mg/dL
Microalb Creat Ratio: 8.2 mg/g (ref 0.0–30.0)
Microalb, Ur: 2.2 mg/dL — ABNORMAL HIGH (ref 0.0–1.9)

## 2023-11-19 NOTE — Progress Notes (Signed)
 Patient ID: Alison Cline, female    DOB: 1955-10-20, 68 y.o.   MRN: [id number]   Assessment & Plan:   Chronic pain of left knee -     Uric acid -     MR KNEE LEFT WO CONTRAST; Future  History of type 2 diabetes mellitus -     Microalbumin / creatinine urine ratio  Screening for colon cancer  Acute cystitis with hematuria -     POCT Urinalysis Dipstick (Automated)  Mixed hyperlipidemia -     CT CARDIAC SCORING (SELF PAY ONLY); Future -     Lipid panel  Family history of heart disease in brother -     CT CARDIAC SCORING (SELF PAY ONLY); Future -     Lipid panel  Chest pain, unspecified type     Assessment and Plan Assessment & Plan Left Knee Pain Chronic left knee pain for 6-8 weeks, exacerbated by walking and weight-bearing activities. Previous cortisone injection provided no relief. Differential diagnosis includes gout versus medial meniscus tear. History of left knee arthroscopy in 1999 for a tear. X-ray and ultrasound showed mild arthritis and swelling, but not enough to account for the severity of pain. Pain is localized to the medial anterior part of the knee and is hot to the touch. Conservative treatments including NSAIDs, rest, heat, and ice have been ineffective. An MRI is recommended to further evaluate the condition, as suggested by the previous orthopedic consultation. - Order MRI of left knee without contrast to evaluate for gout versus medial meniscus tear - Check uric acid level to rule out gout  Chest Pain Recent episode of chest pain with jaw pain, occurring at rest, lasting over five minutes. Family history of heart disease. Previous evaluation at the hospital showed normal troponin and EKG. Referral to cardiologist scheduled for September. A CT coronary calcium  score is recommended to assess for plaque buildup, given the family history and recent symptoms. - Order CT coronary calcium  score to assess for plaque buildup - Advise follow-up with  cardiologist in September - Instruct to report any changes or new symptoms immediately  Hyperlipidemia High cholesterol with family history of heart disease. Previously on Lipitor, but discontinued due to muscle pain. Current management includes lifestyle modifications. A CT coronary calcium  score is planned to evaluate for potential plaque buildup, which may guide future cholesterol management strategies. - Order cholesterol panel to assess current lipid levels - Advise dietary modifications to limit red meat intake  Type 2 Diabetes - prior history Borderline type 2 diabetes, previously managed with metformin and weight loss. Currently not on any diabetes medication and maintaining good glycemic control. Lab Results  Component Value Date   HGBA1C 5.3 09/03/2023   HGBA1C 5.7 05/31/2023   HGBA1C 5.6 08/30/2022     Fibromyalgia Fibromyalgia, well-managed with Cymbalta  and Lyrica . No recent flares despite increased stress levels.  Urinary Tract Infection (UTI) Recent UTI that progressed to a kidney infection due to ineffective initial antibiotic treatment. Currently asymptomatic. Previous hospital visit for chest pain revealed blood in urine, though asymptomatic for UTI at that time. - Perform point of care urine test to check for lingering infection or blood in urine  General Health Maintenance Due for a colonoscopy and a urine check. Referral for colonoscopy already in place. - Ensure colonoscopy is scheduled - Perform urine check during current visit       Subjective:    Chief Complaint  Patient presents with   Knee Pain  Pt in today for left knee pain and labs; pt is fasting for labs;     Knee Pain    Discussed the use of AI scribe software for clinical note transcription with the patient, who gave verbal consent to proceed.  History of Present Illness Alison Cline is a 68 year old female who presents with persistent left knee pain.  She has been  experiencing persistent sharp pain in her left knee for approximately six to eight weeks. The pain is sharp, located on the medial anterior part of the knee, and exacerbated by walking, making activities like climbing stairs or getting in and out of a car difficult. She received a cortisone shot on Oct 18, 2023, which did not provide relief, unlike a previous injection for her right knee that was effective immediately. An x-ray and ultrasound revealed mild arthritis and swelling. She is managing the pain with ibuprofen, elevation, and various creams, but these have not provided significant relief. She had left knee arthroscopy in 1999 for a tear and is concerned about the possibility of a new tear, although she has not experienced any recent trauma to the knee.  She recalls a similar pain from a past gout episode in her toe and wonders if it could be gout in her knee. She has a history of high cholesterol and was previously on Lipitor, which was discontinued due to muscle pain. She is currently managing fibromyalgia with Cymbalta  and Lyrica , which she reports are effective.  She experienced a recent episode of chest pain on Oct 30, 2023, characterized by heaviness in the chest and jaw pain, which resolved after about five minutes. She has a family history of heart disease, with two brothers having had heart issues, one of whom died suddenly at 57.  No current symptoms of a UTI, although she has a history of a urinary tract infection that progressed to a kidney infection due to ineffective initial antibiotic treatment.  She has a 68 year old autistic grandson living with her since August, which has also increased her stress levels.     Past Medical History:  Diagnosis Date   Arthritis    C5 & C6   Bipolar disorder (HCC)    Diabetes mellitus without complication (HCC)    Diabetes mellitus without complication (HCC)    Borderline on metformin, DC's for the last year   Disc herniation    DVT (deep  venous thrombosis) (HCC)    Hypertension    Hypokalemia    Thyroid  disease    Hypothyroid    Past Surgical History:  Procedure Laterality Date   CESAREAN SECTION     X3   CESAREAN SECTION     x3   KNEE ARTHROSCOPY Left    KNEE SURGERY Left     Family History  Problem Relation Age of Onset   Cancer Mother        lukemia   Hypertension Mother    Other Mother        varicose veins   Cancer Father        prostate   Cancer Sister        colon   Cancer Brother        renal   Heart attack Brother        X2   Heart disease Brother    Hypertension Brother    Heart attack Brother    Heart disease Brother     Social History   Tobacco Use   Smoking status:  Never   Smokeless tobacco: Never  Vaping Use   Vaping status: Never Used  Substance Use Topics   Alcohol use: Not Currently    Comment: stopped using in 12/2011   Drug use: No     Allergies  Allergen Reactions   Ambien [Zolpidem] Other (See Comments)    Sleep walk, eat, and drive.   Zolpidem Other (See Comments)    Sleep walks   Zyprexa [Olanzapine] Itching and Swelling    Causes throat to swell   Zyprexa [Olanzapine] Hives    Review of Systems NEGATIVE UNLESS OTHERWISE INDICATED IN HPI      Objective:     BP 120/70 (BP Location: Right Arm, Patient Position: Sitting, Cuff Size: Normal)   Pulse 71   Temp 98.2 F (36.8 C) (Temporal)   Ht 5\' 3"  (1.6 m)   Wt 206 lb 12.8 oz (93.8 kg)   SpO2 97%   BMI 36.63 kg/m   Wt Readings from Last 3 Encounters:  11/19/23 206 lb 12.8 oz (93.8 kg)  10/30/23 200 lb (90.7 kg)  10/17/23 210 lb 3.2 oz (95.3 kg)    BP Readings from Last 3 Encounters:  11/19/23 120/70  10/30/23 (!) 150/72  10/17/23 116/72     Physical Exam Vitals and nursing note reviewed.  Constitutional:      Appearance: Normal appearance. She is obese.  Eyes:     Extraocular Movements: Extraocular movements intact.     Conjunctiva/sclera: Conjunctivae normal.     Pupils: Pupils are  equal, round, and reactive to light.  Cardiovascular:     Rate and Rhythm: Normal rate.  Pulmonary:     Effort: Pulmonary effort is normal.     Breath sounds: Normal breath sounds.  Musculoskeletal:     Left knee: Swelling present. No erythema, ecchymosis or bony tenderness. Normal range of motion. Tenderness present over the medial joint line.     Right lower leg: No edema.     Left lower leg: No edema.  Neurological:     General: No focal deficit present.     Mental Status: She is alert.  Psychiatric:        Mood and Affect: Mood normal.        Mahkai Fangman M Anayansi Rundquist, PA-C

## 2023-11-21 ENCOUNTER — Ambulatory Visit: Admitting: Gastroenterology

## 2023-11-21 ENCOUNTER — Encounter: Payer: Self-pay | Admitting: Gastroenterology

## 2023-11-21 VITALS — BP 116/62 | HR 79 | Ht 63.0 in | Wt 209.2 lb

## 2023-11-21 DIAGNOSIS — R079 Chest pain, unspecified: Secondary | ICD-10-CM

## 2023-11-21 DIAGNOSIS — Z8249 Family history of ischemic heart disease and other diseases of the circulatory system: Secondary | ICD-10-CM | POA: Diagnosis not present

## 2023-11-21 DIAGNOSIS — I2089 Other forms of angina pectoris: Secondary | ICD-10-CM

## 2023-11-21 DIAGNOSIS — Z800 Family history of malignant neoplasm of digestive organs: Secondary | ICD-10-CM

## 2023-11-21 DIAGNOSIS — Z1211 Encounter for screening for malignant neoplasm of colon: Secondary | ICD-10-CM

## 2023-11-21 NOTE — Patient Instructions (Addendum)
 [address], [place], [place] [address] Phone: [phone].  Dr. Veryl Gottron  03/06/2024 at 10:40am.   Please follow up with our office in 6 months  _______________________________________________________  If your blood pressure at your visit was 140/90 or greater, please contact your primary care physician to follow up on this.  _______________________________________________________  If you are age 68 or older, your body mass index should be between 23-30. Your Body mass index is 37.07 kg/m. If this is out of the aforementioned range listed, please consider follow up with your Primary Care Provider.  If you are age 28 or younger, your body mass index should be between 19-25. Your Body mass index is 37.07 kg/m. If this is out of the aformentioned range listed, please consider follow up with your Primary Care Provider.   ________________________________________________________  The [facility] providers would like to encourage you to use MYCHART to communicate with providers for non-urgent requests or questions.  Due to long hold times on the telephone, sending your provider a message by [facility] may be a faster and more efficient way to get a response.  Please allow 48 business hours for a response.  Please remember that this is for non-urgent requests.  _______________________________________________________

## 2023-11-21 NOTE — Progress Notes (Signed)
 Chief Complaint: Colon cancer screening Primary GI MD: Para Bold (requesting Dr. Venice Gillis)  HPI: Discussed the use of AI scribe software for clinical note transcription with the patient, who gave verbal consent to proceed.  History of Present Illness Alison Cline is a 68 year old female who presents for a colonoscopy consultation due to family history of colon cancer.  She has a family history of colon cancer, with her younger sister having passed away from the disease at age 77. Her last colonoscopy was approximately ten years ago, during which polyps were found. She is unable to retrieve records from her previous doctor, who has retired.  She reports a recent episode of chest pain while reading at night, which radiated to her left jaw. She has experienced chest pressure on two additional occasions, once waking her from sleep and another while answering emails at work. She has a significant family history of heart disease, with two brothers having had heart attacks, one of whom passed away at age 40 and the other at 21. She has not seen a cardiologist since September.  She has a history of deep vein thrombosis (DVT) but is not currently on blood thinners. She is awaiting approval for an MRI due to a knee problem that may require arthroscopic surgery.   Past Medical History:  Diagnosis Date   Arthritis    C5 & C6   Bipolar disorder (HCC)    Diabetes mellitus without complication (HCC)    Diabetes mellitus without complication (HCC)    Borderline on metformin, DC's for the last year   Disc herniation    DVT (deep venous thrombosis) (HCC)    Hypertension    Hypokalemia    Thyroid  disease    Hypothyroid    Past Surgical History:  Procedure Laterality Date   CESAREAN SECTION     X3   CESAREAN SECTION     x3   KNEE ARTHROSCOPY Left    KNEE SURGERY Left     Current Outpatient Medications  Medication Sig Dispense Refill   amLODipine  (NORVASC ) 10 MG tablet Take 1 tablet (10  mg total) by mouth daily. 90 tablet 3   cyclobenzaprine  (FLEXERIL ) 10 MG tablet TAKE 1 TABLET BY MOUTH AT BEDTIME AS NEEDED FOR MUSCLE SPASMS 30 tablet 0   DULoxetine  (CYMBALTA ) 60 MG capsule Take 1 capsule (60 mg total) by mouth daily. 90 capsule 3   losartan -hydrochlorothiazide (HYZAAR) 100-25 MG tablet Take 1 tablet by mouth daily. 90 tablet 3   Multiple Vitamin (MULTIVITAMIN WITH MINERALS) TABS tablet Take 1 tablet by mouth every morning.     pregabalin  (LYRICA ) 75 MG capsule TAKE 1 CAPSULE BY MOUTH TWICE A DAY 60 capsule 0   atorvastatin  (LIPITOR) 10 MG tablet Take 1 tablet (10 mg total) by mouth daily. (Patient not taking: Reported on 11/21/2023) 90 tablet 3   cefadroxil  (DURICEF) 500 MG capsule Take 1 capsule (500 mg total) by mouth 2 (two) times daily. (Patient not taking: Reported on 11/21/2023) 12 capsule 0   No current facility-administered medications for this visit.    Allergies as of 11/21/2023 - Review Complete 11/21/2023  Allergen Reaction Noted   Ambien [zolpidem] Other (See Comments) 03/04/2012   Zolpidem Other (See Comments) 04/26/2013   Zyprexa [olanzapine] Itching and Swelling 02/07/2012   Zyprexa [olanzapine] Hives 04/26/2013    Family History  Problem Relation Age of Onset   Cancer Mother        lukemia   Hypertension Mother    Other  Mother        varicose veins   Prostate cancer Father    Colon cancer Sister    Breast cancer Sister    Cancer Brother        renal   Heart attack Brother        X2   Heart disease Brother    Hypertension Brother    Heart attack Brother    Heart disease Brother     Social History   Socioeconomic History   Marital status: Widowed    Spouse name: Not on file   Number of children: 3   Years of education: Not on file   Highest education level: Not on file  Occupational History   Not on file  Tobacco Use   Smoking status: Never   Smokeless tobacco: Never  Vaping Use   Vaping status: Never Used  Substance and Sexual  Activity   Alcohol use: Not Currently    Comment: stopped using in 12/2011   Drug use: No   Sexual activity: Yes    Birth control/protection: Surgical    Comment: BTL  Other Topics Concern   Not on file  Social History Narrative   ** Merged History Encounter **       Social Drivers of Health   Financial Resource Strain: Low Risk  (10/11/2022)   Overall Financial Resource Strain (CARDIA)    Difficulty of Paying Living Expenses: Not hard at all  Food Insecurity: No Food Insecurity (10/11/2022)   Hunger Vital Sign    Worried About Running Out of Food in the Last Year: Never true    Ran Out of Food in the Last Year: Never true  Transportation Needs: No Transportation Needs (10/11/2022)   PRAPARE - Administrator, Civil Service (Medical): No    Lack of Transportation (Non-Medical): No  Physical Activity: Inactive (10/11/2022)   Exercise Vital Sign    Days of Exercise per Week: 0 days    Minutes of Exercise per Session: 0 min  Stress: No Stress Concern Present (10/11/2022)   [employer] of Occupational Health - Occupational Stress Questionnaire    Feeling of Stress : Only a little  Social Connections: Moderately Isolated (10/11/2022)   Social Connection and Isolation Panel [NHANES]    Frequency of Communication with Friends and Family: More than three times a week    Frequency of Social Gatherings with Friends and Family: More than three times a week    Attends Religious Services: More than 4 times per year    Active Member of [employer] or Organizations: No    Attends Banker Meetings: Never    Marital Status: Widowed  Intimate Partner Violence: Not At Risk (10/11/2022)   Humiliation, Afraid, Rape, and Kick questionnaire    Fear of Current or Ex-Partner: No    Emotionally Abused: No    Physically Abused: No    Sexually Abused: No    Review of Systems:    Constitutional: No weight loss, fever, chills, weakness or fatigue HEENT: Eyes: No change in  vision               Ears, Nose, Throat:  No change in hearing or congestion Skin: No rash or itching Cardiovascular: No chest pain, chest pressure or palpitations   Respiratory: No SOB or cough Gastrointestinal: See HPI and otherwise negative Genitourinary: No dysuria or change in urinary frequency Neurological: No headache, dizziness or syncope Musculoskeletal: No new muscle or joint pain Hematologic: No  bleeding or bruising Psychiatric: No history of depression or anxiety    Physical Exam:  Vital signs: BP 116/62 (BP Location: Left Arm, Patient Position: Sitting, Cuff Size: Large)   Pulse 79   Ht 5\' 3"  (1.6 m)   Wt 209 lb 4 oz (94.9 kg)   BMI 37.07 kg/m   Constitutional: NAD, alert and cooperative Head:  Normocephalic and atraumatic. Eyes:   PEERL, EOMI. No icterus. Conjunctiva pink. Respiratory: Respirations even and unlabored. Lungs clear to auscultation bilaterally.   No wheezes, crackles, or rhonchi.  Cardiovascular:  Regular rate and rhythm. No peripheral edema, cyanosis or pallor.  Gastrointestinal:  Soft, nondistended, nontender. No rebound or guarding. Normal bowel sounds. No appreciable masses or hepatomegaly. Rectal:  Declines Msk:  Symmetrical without gross deformities. Without edema, no deformity or joint abnormality.  Neurologic:  Alert and  oriented x4;  grossly normal neurologically.  Skin:   Dry and intact without significant lesions or rashes. Psychiatric: Oriented to person, place and time. Demonstrates good judgement and reason without abnormal affect or behaviors.  RELEVANT LABS AND IMAGING: CBC    Component Value Date/Time   WBC 5.6 10/30/2023 1049   RBC 4.21 10/30/2023 1049   HGB 12.5 10/30/2023 1049   HCT 36.7 10/30/2023 1049   PLT 202 10/30/2023 1049   MCV 87.2 10/30/2023 1049   MCH 29.7 10/30/2023 1049   MCHC 34.1 10/30/2023 1049   RDW 14.6 10/30/2023 1049   LYMPHSABS 1.9 05/31/2023 1453   MONOABS 0.5 05/31/2023 1453   EOSABS 0.1 05/31/2023  1453   BASOSABS 0.0 05/31/2023 1453    CMP     Component Value Date/Time   NA 141 10/30/2023 1049   K 3.4 (L) 10/30/2023 1049   CL 103 10/30/2023 1049   CO2 26 10/30/2023 1049   GLUCOSE 99 10/30/2023 1049   BUN 14 10/30/2023 1049   CREATININE 0.98 10/30/2023 1049   CALCIUM  9.6 10/30/2023 1049   PROT 6.7 10/30/2023 1049   ALBUMIN 4.2 10/30/2023 1049   AST 25 10/30/2023 1049   ALT 27 10/30/2023 1049   ALKPHOS 54 10/30/2023 1049   BILITOT 0.4 10/30/2023 1049   GFRNONAA >60 10/30/2023 1049   GFRAA 65 (L) 04/27/2013 0130     Assessment/Plan:    Assessment & Plan  Angina Recurrent chest pain and no previous cardiac workup recently seen in ED. Possibly anxiety/stress but cannot rule out cardiac etiology especially with strong family history. Family history of myocardial infarction in two brothers (age 68 and 60). Cardiac evaluation prioritized prior to colonoscopy - Urgent cardiology referral for cardiac evaluation before colonoscopy. - Schedule colonoscopy post-cardiac clearance.  Colon cancer screening Family history of colon cancer Reported colonoscopy done over 10 years ago with polyps and recall of 5 years.  Family history of colon cancer in sister who died in her 28s.  No GI symptoms at this time.  Patient is definitely due for screening colonoscopy but with above possible cardiac symptoms she will need cardiac workup prior to undergoing sedation and endoscopic evaluation. - Await cardiac workup - Follow-up 6 months - If cleared from cardiology standpoint with her chest pain can pursue screening colonoscopy   Assigned to Dr. Venice Gillis today (requested patient)    Suzanna Erp, PA-C [facility] 11/21/2023, 9:19 AM  Cc: Allwardt, Deleta Felix, PA-C

## 2023-11-26 ENCOUNTER — Other Ambulatory Visit: Payer: Self-pay | Admitting: Physician Assistant

## 2023-11-26 ENCOUNTER — Other Ambulatory Visit: Payer: Self-pay

## 2023-11-26 ENCOUNTER — Ambulatory Visit
Admission: RE | Admit: 2023-11-26 | Discharge: 2023-11-26 | Disposition: A | Discharge status: Home / Self Care | Source: Ambulatory Visit | Attending: Physician Assistant | Admitting: Physician Assistant

## 2023-11-26 DIAGNOSIS — M23332 Other meniscus derangements, other medial meniscus, left knee: Secondary | ICD-10-CM | POA: Diagnosis not present

## 2023-11-26 DIAGNOSIS — G8929 Other chronic pain: Secondary | ICD-10-CM

## 2023-11-26 DIAGNOSIS — M25462 Effusion, left knee: Secondary | ICD-10-CM | POA: Diagnosis not present

## 2023-11-26 DIAGNOSIS — S83242A Other tear of medial meniscus, current injury, left knee, initial encounter: Secondary | ICD-10-CM

## 2023-11-26 DIAGNOSIS — M7122 Synovial cyst of popliteal space [Baker], left knee: Secondary | ICD-10-CM | POA: Diagnosis not present

## 2023-11-26 DIAGNOSIS — M2242 Chondromalacia patellae, left knee: Secondary | ICD-10-CM | POA: Diagnosis not present

## 2023-11-27 NOTE — Telephone Encounter (Signed)
 Last OV: 11/19/23  Next OV: 03/10/24  Last Filled: 10/11/23  Quantity: 60

## 2023-11-28 ENCOUNTER — Ambulatory Visit (HOSPITAL_BASED_OUTPATIENT_CLINIC_OR_DEPARTMENT_OTHER)
Admission: RE | Admit: 2023-11-28 | Discharge: 2023-11-28 | Disposition: A | Discharge status: Home / Self Care | Payer: Self-pay | Source: Ambulatory Visit | Attending: Physician Assistant | Admitting: Physician Assistant

## 2023-11-28 DIAGNOSIS — E782 Mixed hyperlipidemia: Secondary | ICD-10-CM | POA: Insufficient documentation

## 2023-11-28 DIAGNOSIS — Z8249 Family history of ischemic heart disease and other diseases of the circulatory system: Secondary | ICD-10-CM | POA: Insufficient documentation

## 2023-12-05 DIAGNOSIS — M170 Bilateral primary osteoarthritis of knee: Secondary | ICD-10-CM | POA: Diagnosis not present

## 2023-12-05 NOTE — Telephone Encounter (Signed)
 Ok to refill Flexeril ? Refer to patient's message and please advise.

## 2023-12-06 MED ORDER — CYCLOBENZAPRINE HCL 10 MG PO TABS: 10.0000 mg | ORAL_TABLET | Freq: Every day | ORAL | 0 refills | Status: DC

## 2023-12-06 NOTE — Telephone Encounter (Signed)
 Called and spoke with patient advised pt that we have sent medication refill into the pharmacy and made an appt for patient discuss anxiety options . Pt is aware of appt.

## 2023-12-11 ENCOUNTER — Ambulatory Visit

## 2023-12-24 ENCOUNTER — Ambulatory Visit: Admitting: Physician Assistant

## 2024-01-07 ENCOUNTER — Ambulatory Visit (INDEPENDENT_AMBULATORY_CARE_PROVIDER_SITE_OTHER)

## 2024-01-07 VITALS — Ht 63.0 in | Wt 209.0 lb

## 2024-01-07 DIAGNOSIS — Z0000 Encounter for general adult medical examination without abnormal findings: Secondary | ICD-10-CM | POA: Diagnosis not present

## 2024-01-07 NOTE — Progress Notes (Signed)
 Subjective:   Alison Cline is a 68 y.o. who presents for a Medicare Wellness preventive visit.  As a reminder, Annual Wellness Visits don't include a physical exam, and some assessments may be limited, especially if this visit is performed virtually. We may recommend an in-person follow-up visit with your provider if needed.  Visit Complete: Virtual I connected with  Alison Cline on 01/07/24 by a video and audio enabled telemedicine application and verified that I am speaking with the correct person using two identifiers.  Patient Location: Home  Provider Location: Office/Clinic  I discussed the limitations of evaluation and management by telemedicine. The patient expressed understanding and agreed to proceed.  Vital Signs: Because this visit was a virtual/telehealth visit, some criteria may be missing or patient reported. Any vitals not documented were not able to be obtained and vitals that have been documented are patient reported.    Persons Participating in Visit: Patient.  AWV Questionnaire: No: Patient Medicare AWV questionnaire was not completed prior to this visit.  Cardiac Risk Factors include: advanced age (>56men, >51 women);dyslipidemia;diabetes mellitus;hypertension;obesity (BMI >30kg/m2)     Objective:    Today's Vitals   01/07/24 0950 01/07/24 0951  Weight: 209 lb (94.8 kg)   Height: 5' 3 (1.6 m)   PainSc:  5    Body mass index is 37.02 kg/m.     01/07/2024    9:54 AM 10/11/2022   10:08 AM 08/08/2016   12:49 PM  Advanced Directives  Does Patient Have a Medical Advance Directive? Yes Yes No   Type of Estate agent of [place];Living will Healthcare Power of [place];Living will   Copy of Healthcare Power of Attorney in Chart? No - copy requested No - copy requested      Data saved with a previous flowsheet row definition    Current Medications (verified) Outpatient Encounter Medications as of 01/07/2024  Medication Sig    amLODipine  (NORVASC ) 10 MG tablet Take 1 tablet (10 mg total) by mouth daily.   cyclobenzaprine  (FLEXERIL ) 10 MG tablet Take 1 tablet (10 mg total) by mouth at bedtime.   DULoxetine  (CYMBALTA ) 60 MG capsule Take 1 capsule (60 mg total) by mouth daily.   losartan -hydrochlorothiazide (HYZAAR) 100-25 MG tablet Take 1 tablet by mouth daily.   Multiple Vitamin (MULTIVITAMIN WITH MINERALS) TABS tablet Take 1 tablet by mouth every morning.   pregabalin  (LYRICA ) 75 MG capsule TAKE 1 CAPSULE BY MOUTH TWICE A DAY   [DISCONTINUED] atorvastatin  (LIPITOR) 10 MG tablet Take 1 tablet (10 mg total) by mouth daily. (Patient not taking: Reported on 11/21/2023)   [DISCONTINUED] cefadroxil  (DURICEF) 500 MG capsule Take 1 capsule (500 mg total) by mouth 2 (two) times daily. (Patient not taking: Reported on 11/21/2023)   No facility-administered encounter medications on file as of 01/07/2024.    Allergies (verified) Ambien [zolpidem], Zolpidem, Zyprexa [olanzapine], and Zyprexa [olanzapine]   History: Past Medical History:  Diagnosis Date   Arthritis    C5 & C6   Bipolar disorder (HCC)    Diabetes mellitus without complication (HCC)    Diabetes mellitus without complication (HCC)    Borderline on metformin, DC's for the last year   Disc herniation    DVT (deep venous thrombosis) (HCC)    Hypertension    Hypokalemia    Thyroid  disease    Hypothyroid   Past Surgical History:  Procedure Laterality Date   CESAREAN SECTION     X3   CESAREAN SECTION  x3   KNEE ARTHROSCOPY Left    KNEE SURGERY Left    Family History  Problem Relation Age of Onset   Cancer Mother        lukemia   Hypertension Mother    Other Mother        varicose veins   Prostate cancer Father    Colon cancer Sister    Breast cancer Sister    Cancer Brother        renal   Heart attack Brother        X2   Heart disease Brother    Hypertension Brother    Heart attack Brother    Heart disease Brother    Social History    Socioeconomic History   Marital status: Widowed    Spouse name: Not on file   Number of children: 3   Years of education: Not on file   Highest education level: Not on file  Occupational History   Not on file  Tobacco Use   Smoking status: Never   Smokeless tobacco: Never  Vaping Use   Vaping status: Never Used  Substance and Sexual Activity   Alcohol use: Yes    Comment: stopped using in 12/2011   Drug use: No   Sexual activity: Yes    Birth control/protection: Surgical    Comment: BTL  Other Topics Concern   Not on file  Social History Narrative   ** Merged History Encounter **       Social Drivers of Health   Financial Resource Strain: Low Risk  (01/07/2024)   Overall Financial Resource Strain (CARDIA)    Difficulty of Paying Living Expenses: Not hard at all  Food Insecurity: No Food Insecurity (01/07/2024)   Hunger Vital Sign    Worried About Running Out of Food in the Last Year: Never true    Ran Out of Food in the Last Year: Never true  Transportation Needs: No Transportation Needs (01/07/2024)   PRAPARE - Administrator, Civil Service (Medical): No    Lack of Transportation (Non-Medical): No  Physical Activity: Inactive (01/07/2024)   Exercise Vital Sign    Days of Exercise per Week: 0 days    Minutes of Exercise per Session: 0 min  Stress: No Stress Concern Present (01/07/2024)   [employer] of Occupational Health - Occupational Stress Questionnaire    Feeling of Stress: Not at all  Social Connections: Moderately Isolated (01/07/2024)   Social Connection and Isolation Panel    Frequency of Communication with Friends and Family: More than three times a week    Frequency of Social Gatherings with Friends and Family: More than three times a week    Attends Religious Services: More than 4 times per year    Active Member of [employer] or Organizations: No    Attends Banker Meetings: Never    Marital Status: Widowed    Tobacco  Counseling Counseling given: Not Answered    Clinical Intake:  Pre-visit preparation completed: Yes  Pain : 0-10 Pain Score: 5  Pain Type: Chronic pain Pain Location: Knee Pain Orientation: Left Pain Descriptors / Indicators: Aching, Stabbing Pain Onset: More than a month ago Pain Frequency: Intermittent     BMI - recorded: 37.02 Nutritional Status: BMI > 30  Obese Nutritional Risks: None Diabetes: Yes CBG done?: No Did pt. bring in CBG monitor from home?: No  Lab Results  Component Value Date   HGBA1C 5.3 09/03/2023   HGBA1C 5.7  05/31/2023   HGBA1C 5.6 08/30/2022     How often do you need to have someone help you when you read instructions, pamphlets, or other written materials from your doctor or pharmacy?: 1 - Never  Interpreter Needed?: No  Information entered by :: Alison Haws, LPN   Activities of Daily Living     01/07/2024    9:59 AM  In your present state of health, do you have any difficulty performing the following activities:  Hearing? 0  Vision? 0  Difficulty concentrating or making decisions? 0  Walking or climbing stairs? 0  Dressing or bathing? 0  Doing errands, shopping? 0  Preparing Food and eating ? N  Using the Toilet? N  In the past six months, have you accidently leaked urine? N  Do you have problems with loss of bowel control? N  Managing your Medications? N  Managing your Finances? N  Housekeeping or managing your Housekeeping? N    Patient Care Team: Allwardt, Alyssa M, PA-C as PCP - General (Physician Assistant) Manda Fess, MD as Attending Physician (Obstetrics and Gynecology) Kiki Familia, MD (Family Medicine)  I have updated your Care Teams any recent Medical Services you may have received from other providers in the past year.     Assessment:   This is a routine wellness examination for Alison Cline.  Hearing/Vision screen Hearing Screening - Comments:: Pt denies any hearing issues  Vision Screening - Comments::  Wears rx glasses - up to date with routine eye exams with [facility]    Goals Addressed             This Visit's Progress    Patient Stated       Weight loss        Depression Screen     01/07/2024    9:55 AM 11/19/2023    9:47 AM 10/17/2023    1:31 PM 09/03/2023    9:07 AM 06/21/2023    8:42 AM 03/05/2023    9:03 AM 10/11/2022   10:03 AM  PHQ 2/9 Scores  PHQ - 2 Score 0 0 0 0 0 0 1  PHQ- 9 Score 0 1   5 6 4     Fall Risk     01/07/2024    9:56 AM 11/19/2023    9:46 AM 10/17/2023    1:30 PM 09/03/2023    9:07 AM 06/21/2023    8:42 AM  Fall Risk   Falls in the past year? 0 0 0 0 0  Number falls in past yr: 0 0 0 0 0  Injury with Fall? 0 0 0 0 0  Risk for fall due to : No Fall Risks No Fall Risks No Fall Risks No Fall Risks No Fall Risks  Follow up Falls prevention discussed Falls evaluation completed Falls evaluation completed Falls evaluation completed Falls evaluation completed    MEDICARE RISK AT HOME:  Medicare Risk at Home Any stairs in or around the home?: Yes If so, are there any without handrails?: No Home free of loose throw rugs in walkways, pet beds, electrical cords, etc?: Yes Adequate lighting in your home to reduce risk of falls?: Yes Life alert?: No Use of a cane, walker or w/c?: No Grab bars in the bathroom?: Yes Shower chair or bench in shower?: No Elevated toilet seat or a handicapped toilet?: No  TIMED UP AND GO:  Was the test performed?  No  Cognitive Function: 6CIT completed  01/07/2024    9:57 AM 10/11/2022   10:12 AM  6CIT Screen  What Year? 0 points 0 points  What month? 0 points 0 points  What time? 0 points 0 points  Count back from 20 0 points 0 points  Months in reverse 0 points 0 points  Repeat phrase 0 points 0 points  Total Score 0 points 0 points    Immunizations Immunization History  Administered Date(s) Administered   PNEUMOCOCCAL CONJUGATE-20 08/30/2022   Tdap 03/05/2023    Screening Tests Health  Maintenance  Topic Date Due   Colonoscopy  Never done   Zoster Vaccines- Shingrix (1 of 2) Never done   HEMOGLOBIN A1C  12/04/2023   Hepatitis C Screening  09/02/2024 (Originally 06/24/1973)   INFLUENZA VACCINE  01/17/2024   MAMMOGRAM  05/03/2024   FOOT EXAM  09/02/2024   OPHTHALMOLOGY EXAM  09/12/2024   Diabetic kidney evaluation - eGFR measurement  10/29/2024   Diabetic kidney evaluation - Urine ACR  11/18/2024   Medicare Annual Wellness (AWV)  01/06/2025   DTaP/Tdap/Td (2 - Td or Tdap) 03/04/2033   Pneumococcal Vaccine: 50+ Years  Completed   DEXA SCAN  Completed   Hepatitis B Vaccines  Aged Out   HPV VACCINES  Aged Out   Meningococcal B Vaccine  Aged Out   COVID-19 Vaccine  Discontinued    Health Maintenance  Health Maintenance Due  Topic Date Due   Colonoscopy  Never done   Zoster Vaccines- Shingrix (1 of 2) Never done   HEMOGLOBIN A1C  12/04/2023   Health Maintenance Items Addressed: See Nurse Notes at the end of this note  Additional Screening:  Vision Screening: Recommended annual ophthalmology exams for early detection of glaucoma and other disorders of the eye. Would you like a referral to an eye doctor? No    Dental Screening: Recommended annual dental exams for proper oral hygiene  Community Resource Referral / Chronic Care Management: CRR required this visit?  No   CCM required this visit?  No   Plan:    I have personally reviewed and noted the following in the patient's chart:   Medical and social history Use of alcohol, tobacco or illicit drugs  Current medications and supplements including opioid prescriptions. Patient is not currently taking opioid prescriptions. Functional ability and status Nutritional status Physical activity Advanced directives List of other physicians Hospitalizations, surgeries, and ER visits in previous 12 months Vitals Screenings to include cognitive, depression, and falls Referrals and appointments  In addition,  I have reviewed and discussed with patient certain preventive protocols, quality metrics, and best practice recommendations. A written personalized care plan for preventive services as well as general preventive health recommendations were provided to patient.   Alison VEAR Haws, LPN   2/77/7974   After Visit Summary: (MyChart) Due to this being a telephonic visit, the after visit summary with patients personalized plan was offered to patient via MyChart   Notes: Nothing significant to report at this time.

## 2024-01-07 NOTE — Patient Instructions (Signed)
 Alison Cline , Thank you for taking time out of your busy schedule to complete your Annual Wellness Visit with me. I enjoyed our conversation and look forward to speaking with you again next year. I, as well as your care team,  appreciate your ongoing commitment to your health goals. Please review the following plan we discussed and let me know if I can assist you in the future. Your Game plan/ To Do List    Referrals: If you haven't heard from the office you've been referred to, please reach out to them at the phone provided.   Follow up Visits: Next Medicare AWV with our clinical staff: 01/12/25   Have you seen your provider in the last 6 months (3 months if uncontrolled diabetes)? Yes Next Office Visit with your provider: 03/10/24  Clinician Recommendations:  Aim for 30 minutes of exercise or brisk walking, 6-8 glasses of water, and 5 servings of fruits and vegetables each day.       This is a list of the screening recommended for you and due dates:  Health Maintenance  Topic Date Due   Colon Cancer Screening  Never done   Zoster (Shingles) Vaccine (1 of 2) Never done   Hemoglobin A1C  12/04/2023   Hepatitis C Screening  09/02/2024*   Flu Shot  01/17/2024   Mammogram  05/03/2024   Complete foot exam   09/02/2024   Eye exam for diabetics  09/12/2024   Yearly kidney function blood test for diabetes  10/29/2024   Yearly kidney health urinalysis for diabetes  11/18/2024   Medicare Annual Wellness Visit  01/06/2025   DTaP/Tdap/Td vaccine (2 - Td or Tdap) 03/04/2033   Pneumococcal Vaccine for age over 96  Completed   DEXA scan (bone density measurement)  Completed   Hepatitis B Vaccine  Aged Out   HPV Vaccine  Aged Out   Meningitis B Vaccine  Aged Out   COVID-19 Vaccine  Discontinued  *Topic was postponed. The date shown is not the original due date.    Advanced directives: (Copy Requested) Please bring a copy of your health care power of attorney and living will to the office to be  added to your chart at your convenience. You can mail to [facility] [address] [place], [place] [address] or email to [email or web address] Advance Care Planning is important because it:  [x]  Makes sure you receive the medical care that is consistent with your values, goals, and preferences  [x]  It provides guidance to your family and loved ones and reduces their decisional burden about whether or not they are making the right decisions based on your wishes.  Follow the link provided in your after visit summary or read over the paperwork we have mailed to you to help you started getting your Advance Directives in place. If you need assistance in completing these, please reach out to us  so that we can help you!  See attachments for Preventive Care and Fall Prevention Tips.

## 2024-01-11 ENCOUNTER — Other Ambulatory Visit: Payer: Self-pay | Admitting: Physician Assistant

## 2024-01-13 NOTE — Telephone Encounter (Signed)
 Last OV: 11/19/23  Next OV: 03/10/24  Last Filled: 11/27/23  Quantity: 60

## 2024-01-30 DIAGNOSIS — M1712 Unilateral primary osteoarthritis, left knee: Secondary | ICD-10-CM | POA: Diagnosis not present

## 2024-02-06 DIAGNOSIS — M1712 Unilateral primary osteoarthritis, left knee: Secondary | ICD-10-CM | POA: Diagnosis not present

## 2024-02-13 DIAGNOSIS — M1712 Unilateral primary osteoarthritis, left knee: Secondary | ICD-10-CM | POA: Diagnosis not present

## 2024-02-20 ENCOUNTER — Ambulatory Visit (HOSPITAL_BASED_OUTPATIENT_CLINIC_OR_DEPARTMENT_OTHER): Admitting: Cardiology

## 2024-03-02 ENCOUNTER — Other Ambulatory Visit: Payer: Self-pay | Admitting: Physician Assistant

## 2024-03-02 NOTE — Telephone Encounter (Signed)
 Last OV: 11/19/23  Next OV: 03/10/24  Last Filled: 01/13/24  Quantity: 60

## 2024-03-03 ENCOUNTER — Other Ambulatory Visit: Payer: Self-pay | Admitting: Physician Assistant

## 2024-03-03 DIAGNOSIS — M797 Fibromyalgia: Secondary | ICD-10-CM

## 2024-03-03 NOTE — Progress Notes (Signed)
 Cardiology Office Note:  .   Date:  03/04/2024  ID:  Alison Cline, DOB 1956-02-24, MRN [id number] PCP: Allwardt, Alison [name], PA-C  [facility] Providers Cardiologist:  Alison Bruckner, MD {  History of Present Illness: .   Alison Cline is a 68 y.o. female with PMH hyperlipidemia, FH of heart disease, hypertension. She is seen today as a new patient consultation for chest pain at the request of Alison Cline.  Referral from 11/21/23 from [facility] notes diagnosis of angina at rest. On review of chart, this referral originated at a GI visit for a colonoscopy consultation. Patient reported an episode of chest pain radiating to her jaw at rest and two addition episodes of chest pressure, one while sleeping and one while sitting. Noted strong family history of heart disease.  In the last 9 months, she has had two episodes of chest pain. One woke her from sleep, and one happened at work. Both times, her right arm hurt and she had chest pressure and shortness of breath. Both times she went to get checked, once at [facility] (01/22/22) and once at [facility] (10/30/23). Both workups were unremarkable  FH: older brother had several MI between ages 53-73, had CABG, died of MI age 19. Younger brother was 79, was having irregular heart beats, wore a monitor, but died of MI two weeks later.   She was on a statin previously, has been off for about 6 mos as it seemed to worsen her fibromyalgia. This was atorvastatin  10 mg. Reviewed her labs from 11/19/23, when she was off statin.  Blood pressure has been well controlled outside of the office.   Walking has been limited by knee pain.   Has intermittent LE edema, more L>R. She shows pictures today. Better with elevating her feet. Wears compression stockings. Has had phlebitis and varicose veins in the left leg.  ROS: Denies shortness of breath at rest or with normal exertion. No PND, orthopnea, or unexpected weight gain. No syncope or  palpitations. ROS otherwise negative except as noted.   Studies Reviewed: SABRA    EKG:  EKG Interpretation Date/Time:  Wednesday March 04 2024 10:53:58 EDT Ventricular Rate:  80 PR Interval:  148 QRS Duration:  84 QT Interval:  372 QTC Calculation: 429 R Axis:   -14  Text Interpretation: Normal sinus rhythm Normal ECG When compared with ECG of 30-Oct-2023 10:05, No significant change was found Confirmed by Cline Alison [phone]) on 03/04/2024 11:17:01 AM    Physical Exam:   VS:  BP 138/84   Pulse 80   Resp 17   Ht 5' 3 (1.6 m)   Wt 209 lb (94.8 kg)   SpO2 97%   BMI 37.02 kg/m    Wt Readings from Last 3 Encounters:  03/04/24 209 lb (94.8 kg)  01/07/24 209 lb (94.8 kg)  11/21/23 209 lb 4 oz (94.9 kg)    GEN: Well nourished, well developed in no acute distress HEENT: Normal, moist mucous membranes NECK: No JVD CARDIAC: regular rhythm, normal S1 and S2, no rubs or gallops. No murmur. VASCULAR: Radial and DP pulses 2+ bilaterally. No carotid bruits RESPIRATORY:  Clear to auscultation without rales, wheezing or rhonchi  ABDOMEN: Soft, non-tender, non-distended MUSCULOSKELETAL:  Ambulates independently SKIN: Warm and dry, no edema NEUROLOGIC:  Alert and oriented x 3. No focal neuro deficits noted. PSYCHIATRIC:  Normal affect    ASSESSMENT AND PLAN: .    Chest pain Family history of premature CAD Mixed hyperlipidemia Statin  myalgia -we discussed that chest pain at rest is concerning for unstable angina, and this is a medical emergency that needs evaluation in the ER. She did report to the ER both of prior episodes, with reassuring workup. She knows to re-present if symptoms recur -she had a calcium  score that was 0.92 (low risk), but we reviewed that this is meant as a screening tool and not an evaluation for chest pain. Discussed that noncalcified plaque is not visualized on calcium  score and is often the main player in acute coronary syndrome. -discussed treadmill  stress, nuclear stress/lexiscan, and CT coronary angiography. Discussed pros and cons of each, including but not limited to false positive/false negative risk, radiation risk, and risk of IV contrast dye. Based on shared decision making, decision was made to pursue CT coronary angiography. -will give one time dose of metoprolol  2 hours prior to scheduled test -counseled on need to get BMET prior to test -counseled on use of sublingual nitroglycerin and its importance to a good test -reviewed red flag warning signs that need immediate medical attention  -will check lp(a) -if nonobstructive CAD, would trial low dose rosuvastatin -has never been on aspirin, if CAD would start  Hypertension -improved on recheck, reports good control at home -continue amlodipine , losartan -HCTZ  CV risk counseling and prevention -recommend heart healthy/Mediterranean diet, with whole grains, fruits, vegetable, fish, lean meats, nuts, and olive oil. Limit salt. -recommend moderate walking, 3-5 times/week for 30-50 minutes each session. Aim for at least 150 minutes/week. Goal should be pace of 3 miles/hours, or walking 1.5 miles in 30 minutes -recommend avoidance of tobacco products. Avoid excess alcohol. -ASCVD risk score: The 10-year ASCVD risk score (Arnett DK, et al., 2019) is: 22.3%   Values used to calculate the score:     Age: 71 years     Clincally relevant sex: Female     Is Non-Hispanic African American: No     Diabetic: Yes     Tobacco smoker: No     Systolic Blood Pressure: 138 mmHg     Is BP treated: Yes     HDL Cholesterol: 56 mg/dL     Total Cholesterol: 223 mg/dL    Dispo: if testing is low risk, follow up 1 year   Signed, Alison Bruckner, MD   Alison Bruckner, MD, PhD, [facility] [place]  [facility]    Heart & Vascular at [facility] at [facility] [address], [address] [place], [place] [address] [phone]

## 2024-03-04 ENCOUNTER — Ambulatory Visit (HOSPITAL_BASED_OUTPATIENT_CLINIC_OR_DEPARTMENT_OTHER): Admitting: Cardiology

## 2024-03-04 ENCOUNTER — Encounter (HOSPITAL_BASED_OUTPATIENT_CLINIC_OR_DEPARTMENT_OTHER): Payer: Self-pay | Admitting: Cardiology

## 2024-03-04 VITALS — BP 138/84 | HR 80 | Resp 17 | Ht 63.0 in | Wt 209.0 lb

## 2024-03-04 DIAGNOSIS — Z8249 Family history of ischemic heart disease and other diseases of the circulatory system: Secondary | ICD-10-CM | POA: Diagnosis not present

## 2024-03-04 DIAGNOSIS — I10 Essential (primary) hypertension: Secondary | ICD-10-CM | POA: Diagnosis not present

## 2024-03-04 DIAGNOSIS — M791 Myalgia, unspecified site: Secondary | ICD-10-CM | POA: Diagnosis not present

## 2024-03-04 DIAGNOSIS — T466X5A Adverse effect of antihyperlipidemic and antiarteriosclerotic drugs, initial encounter: Secondary | ICD-10-CM | POA: Diagnosis not present

## 2024-03-04 DIAGNOSIS — E782 Mixed hyperlipidemia: Secondary | ICD-10-CM | POA: Diagnosis not present

## 2024-03-04 DIAGNOSIS — R072 Precordial pain: Secondary | ICD-10-CM

## 2024-03-04 DIAGNOSIS — T466X5D Adverse effect of antihyperlipidemic and antiarteriosclerotic drugs, subsequent encounter: Secondary | ICD-10-CM

## 2024-03-04 LAB — BASIC METABOLIC PANEL WITH GFR
BUN/Creatinine Ratio: 12 (ref 12–28)
BUN: 12 mg/dL (ref 8–27)
CO2: 25 mmol/L (ref 20–29)
Calcium: 9.4 mg/dL (ref 8.7–10.3)
Chloride: 102 mmol/L (ref 96–106)
Creatinine, Ser: 1.04 mg/dL — ABNORMAL HIGH (ref 0.57–1.00)
Glucose: 100 mg/dL — ABNORMAL HIGH (ref 70–99)
Potassium: 3.1 mmol/L — ABNORMAL LOW (ref 3.5–5.2)
Sodium: 142 mmol/L (ref 134–144)
eGFR: 59 mL/min/1.73 — ABNORMAL LOW (ref >59)

## 2024-03-04 MED ORDER — METOPROLOL TARTRATE 50 MG PO TABS: 50.0000 mg | ORAL_TABLET | Freq: Once | ORAL | 0 refills | Status: DC

## 2024-03-04 NOTE — Patient Instructions (Signed)
 Medication Instructions:   Your physician recommends that you continue on your current medications as directed. Please refer to the Current Medication list given to you today.  *If you need a refill on your cardiac medications before your next appointment, please call your pharmacy*   Lab Work:  TODAY ON THE 3RD FLOOR AT [address] PRIMARY CARE--LIPOPROTEIN A AND BMET  If you have labs (blood work) drawn today and your tests are completely normal, you will receive your results only by: MyChart Message (if you have MyChart) OR A paper copy in the mail If you have any lab test that is abnormal or we need to change your treatment, we will call you to review the results.   Testing/Procedures:    Your cardiac CT will be scheduled at one of the below locations:    [facility] [address]  [place], [place] [address] [phone]    If scheduled at the Heart and Vascular Tower at [facility] street, please enter the parking lot using the [address] entrance and use the FREE valet service at the patient drop-off area. Enter the building and check-in with registration on the main floor.   Please follow these instructions carefully (unless otherwise directed):  An IV will be required for this test and Nitroglycerin will be given.  Hold all erectile dysfunction medications at least 3 days (72 hrs) prior to test. (Ie viagra, cialis, sildenafil, tadalafil, etc)   On the Night Before the Test: Be sure to Drink plenty of water. Do not consume any caffeinated/decaffeinated beverages or chocolate 12 hours prior to your test. Do not take any antihistamines 12 hours prior to your test.    On the Day of the Test: Drink plenty of water until 1 hour prior to the test. Do not eat any food 1 hour prior to test. You may take your regular medications prior to the test.  Take metoprolol  (Lopressor ) 50 MG BY MOUTH two hours prior to test. If you take  Furosemide/Hydrochlorothiazide/Spironolactone/Chlorthalidone, please HOLD on the morning of the test.  PLEASE HOLD YOUR HYZAAR THE MORNING OF THIS TEST Patients who wear a continuous glucose monitor MUST remove the device prior to scanning. FEMALES- please wear underwire-free bra if available, avoid dresses & tight clothing       After the Test: Drink plenty of water. After receiving IV contrast, you may experience a mild flushed feeling. This is normal. On occasion, you may experience a mild rash up to 24 hours after the test. This is not dangerous. If this occurs, you can take Benadryl  25 mg, Zyrtec, Claritin, or Allegra and increase your fluid intake. (Patients taking Tikosyn should avoid Benadryl , and may take Zyrtec, Claritin, or Allegra) If you experience trouble breathing, this can be serious. If it is severe call 911 IMMEDIATELY. If it is mild, please call our office.  We will call to schedule your test 2-4 weeks out understanding that some insurance companies will need an authorization prior to the service being performed.   For more information and frequently asked questions, please visit our website : [email or web address]  For non-scheduling related questions, please contact the cardiac imaging nurse navigator should you have any questions/concerns: Cardiac Imaging Nurse Navigators Direct Office Dial: [phone]   For scheduling needs, including cancellations and rescheduling, please call [place], [phone].   Follow-Up: At [facility], you and your health needs are our priority.  As part of our continuing mission to provide you with exceptional heart  care, our providers are all part of one team.  This team includes your primary Cardiologist (physician) and Advanced Practice Providers or APPs (Physician Assistants and Nurse Practitioners) who all work together to provide you with the care you need, when you need it.  Your next appointment:   1  year(s)  Provider:   Shelda Bruckner, MD, Rosaline Bane, NP, or Reche Finder, NP

## 2024-03-05 ENCOUNTER — Encounter (HOSPITAL_BASED_OUTPATIENT_CLINIC_OR_DEPARTMENT_OTHER): Payer: Self-pay

## 2024-03-05 ENCOUNTER — Encounter (HOSPITAL_COMMUNITY): Payer: Self-pay

## 2024-03-05 LAB — LIPOPROTEIN A (LPA): Lipoprotein (a): 22.9 nmol/L (ref <75.0)

## 2024-03-09 ENCOUNTER — Ambulatory Visit (HOSPITAL_COMMUNITY)
Admission: RE | Admit: 2024-03-09 | Discharge: 2024-03-09 | Disposition: A | Discharge status: Home / Self Care | Source: Ambulatory Visit | Attending: Cardiology | Admitting: Cardiology

## 2024-03-09 DIAGNOSIS — R072 Precordial pain: Secondary | ICD-10-CM | POA: Insufficient documentation

## 2024-03-09 DIAGNOSIS — Z8249 Family history of ischemic heart disease and other diseases of the circulatory system: Secondary | ICD-10-CM | POA: Insufficient documentation

## 2024-03-09 DIAGNOSIS — I2510 Atherosclerotic heart disease of native coronary artery without angina pectoris: Secondary | ICD-10-CM | POA: Diagnosis not present

## 2024-03-09 MED ORDER — NITROGLYCERIN 0.4 MG SL SUBL
0.8000 mg | SUBLINGUAL_TABLET | Freq: Once | SUBLINGUAL | Status: AC
  Administered 2024-03-09: 0.8 mg via SUBLINGUAL

## 2024-03-09 MED ORDER — IOHEXOL 350 MG/ML SOLN
100.0000 mL | Freq: Once | INTRAVENOUS | Status: AC | PRN
Start: 2024-03-09 — End: 2024-03-09
  Administered 2024-03-09: 100 mL via INTRAVENOUS

## 2024-03-10 ENCOUNTER — Encounter: Payer: Self-pay | Admitting: Physician Assistant

## 2024-03-10 ENCOUNTER — Ambulatory Visit (INDEPENDENT_AMBULATORY_CARE_PROVIDER_SITE_OTHER): Admitting: Physician Assistant

## 2024-03-10 ENCOUNTER — Other Ambulatory Visit: Payer: Self-pay | Admitting: Physician Assistant

## 2024-03-10 ENCOUNTER — Ambulatory Visit: Payer: Self-pay | Admitting: Physician Assistant

## 2024-03-10 VITALS — BP 122/62 | HR 70 | Temp 98.1°F | Ht 62.5 in | Wt 209.8 lb

## 2024-03-10 DIAGNOSIS — Z800 Family history of malignant neoplasm of digestive organs: Secondary | ICD-10-CM | POA: Diagnosis not present

## 2024-03-10 DIAGNOSIS — R600 Localized edema: Secondary | ICD-10-CM | POA: Diagnosis not present

## 2024-03-10 DIAGNOSIS — E559 Vitamin D deficiency, unspecified: Secondary | ICD-10-CM

## 2024-03-10 DIAGNOSIS — M62838 Other muscle spasm: Secondary | ICD-10-CM | POA: Diagnosis not present

## 2024-03-10 DIAGNOSIS — M25562 Pain in left knee: Secondary | ICD-10-CM

## 2024-03-10 DIAGNOSIS — Z23 Encounter for immunization: Secondary | ICD-10-CM | POA: Diagnosis not present

## 2024-03-10 DIAGNOSIS — E876 Hypokalemia: Secondary | ICD-10-CM

## 2024-03-10 DIAGNOSIS — Z1211 Encounter for screening for malignant neoplasm of colon: Secondary | ICD-10-CM | POA: Diagnosis not present

## 2024-03-10 DIAGNOSIS — M797 Fibromyalgia: Secondary | ICD-10-CM

## 2024-03-10 DIAGNOSIS — Z8639 Personal history of other endocrine, nutritional and metabolic disease: Secondary | ICD-10-CM | POA: Diagnosis not present

## 2024-03-10 DIAGNOSIS — G8929 Other chronic pain: Secondary | ICD-10-CM

## 2024-03-10 DIAGNOSIS — R739 Hyperglycemia, unspecified: Secondary | ICD-10-CM | POA: Diagnosis not present

## 2024-03-10 DIAGNOSIS — R799 Abnormal finding of blood chemistry, unspecified: Secondary | ICD-10-CM

## 2024-03-10 LAB — COMPREHENSIVE METABOLIC PANEL WITH GFR
ALT: 21 U/L (ref 0–35)
AST: 19 U/L (ref 0–37)
Albumin: 4.1 g/dL (ref 3.5–5.2)
Alkaline Phosphatase: 66 U/L (ref 39–117)
BUN: 11 mg/dL (ref 6–23)
CO2: 30 meq/L (ref 19–32)
Calcium: 9.6 mg/dL (ref 8.4–10.5)
Chloride: 104 meq/L (ref 96–112)
Creatinine, Ser: 0.93 mg/dL (ref 0.40–1.20)
GFR: 63.07 mL/min (ref >60.00)
Glucose, Bld: 96 mg/dL (ref 70–99)
Potassium: 3.7 meq/L (ref 3.5–5.1)
Sodium: 142 meq/L (ref 135–145)
Total Bilirubin: 0.3 mg/dL (ref 0.2–1.2)
Total Protein: 6.9 g/dL (ref 6.0–8.3)

## 2024-03-10 LAB — HEMOGLOBIN A1C: Hgb A1c MFr Bld: 5.7 % (ref 4.6–6.5)

## 2024-03-10 LAB — TSH: TSH: 2.98 u[IU]/mL (ref 0.35–5.50)

## 2024-03-10 LAB — VITAMIN D 25 HYDROXY (VIT D DEFICIENCY, FRACTURES): VITD: 17.98 ng/mL — ABNORMAL LOW (ref 30.00–100.00)

## 2024-03-10 MED ORDER — VITAMIN D (ERGOCALCIFEROL) 1.25 MG (50000 UNIT) PO CAPS: 50000.0000 [IU] | ORAL_CAPSULE | ORAL | 0 refills | Status: AC

## 2024-03-10 NOTE — Patient Instructions (Signed)
  VISIT SUMMARY: You had a follow-up visit to discuss your knee pain, recent lab results, and other health concerns. Your knee pain has improved after gel injections, but you still have some discomfort. We reviewed your lab results, which showed stage 3 kidney disease and low potassium levels. We also discussed your ankle swelling, fibromyalgia, muscle spasms, thyroid  function, vitamin D  levels, and the need for a colonoscopy and shingles vaccination.  YOUR PLAN: OSTEOARTHRITIS OF KNEE: Your knee pain has improved after gel injections, but some arthritis pain remains. -Continue with your current activities and rest and ice your knee after work as needed.  Abnormal BMP: Dehydration may have affected the results. -We will recheck your kidney function labs to confirm the diagnosis.  HYPOKALEMIA: Your potassium levels are consistently low, likely due to your blood pressure medication. -Consider increasing your potassium supplement dosage. -Eat more potassium-rich foods like bananas, oranges, and mangoes. -We will recheck your potassium levels.  EDEMA OF LOWER EXTREMITIES: You have swelling in your ankles, especially the left one, which worsens by the end of the day. -Continue wearing your support hose. -Elevate your legs above heart level at night.  FIBROMYALGIA: Your fibromyalgia is well-managed with your current medications, Lyrica  and Cymbalta . -Continue taking Lyrica  and Cymbalta  as prescribed. -Request refills as needed.  MUSCLE SPASM OF SHOULDERS AND NECK: You have intermittent muscle spasms in your shoulders and neck. -Continue using Flexeril  as needed.  THYROID  DISORDER: Your thyroid  function is currently normal without medication. -We will recheck your thyroid  levels.  VITAMIN D  DEFICIENCY: Your vitamin D  levels are low. -Continue taking your over-the-counter vitamin D3 drops. -We will recheck your vitamin D  levels.  COLORECTAL CANCER SCREENING: You are due for a colonoscopy due  to your history of polyps and family history of colorectal cancer. -We will refer you for a colonoscopy.  SHINGLES VACCINATION: We discussed the shingles vaccine, and Shingrix is recommended for better efficacy. -Consider getting the Shingrix vaccination at your pharmacy.  PNEUMONIA VACCINATION: You are up to date with your pneumonia vaccine, Prevnar 20. -No further action needed at this time.                      Contains text generated by Abridge.                                 Contains text generated by Abridge.

## 2024-03-10 NOTE — Progress Notes (Signed)
 Patient ID: Alison Cline, female    DOB: 1955-10-24, 68 y.o.   MRN: [id number]   Assessment & Plan:  Fibromyalgia  Muscle spasms of neck  Screening for colon cancer -     Ambulatory referral to Gastroenterology  History of thyroid  disorder -     TSH  Hypokalemia -     Comprehensive metabolic panel with GFR  Vitamin D  deficiency -     VITAMIN D  25 Hydroxy (Vit-D Deficiency, Fractures)  Hyperglycemia -     Hemoglobin A1c  Immunization due -     Flu vaccine HIGH DOSE PF(Fluzone Trivalent)  Family history of colon cancer -     Ambulatory referral to Gastroenterology  Chronic pain of left knee  Abnormal blood chemistry  Edema of both lower legs      Assessment and Plan Assessment & Plan Osteoarthritis of knee Improvement in knee pain after gel injections. Residual arthritis pain persists without stabbing pain with each step.  Abnormal BMP Recent blood work indicated stage 3 kidney disease. Previous urine test showed no protein, suggesting normal kidney function. Possible dehydration during the test may have affected results. - Recheck kidney function labs  Hypokalemia Consistently low potassium levels, likely due to antihypertensive medication. Currently taking a potassium supplement, but levels remain low. - Consider increasing potassium supplement dosage - Encourage consumption of potassium-rich foods such as bananas, oranges, and mangoes - Recheck potassium levels  Edema of lower extremities, left worse than right Swelling in ankles, left worse than right, worsens by end of day. Likely due to dependent gravity and vessel insufficiency. No signs of heart failure or other underlying causes identified. - Continue wearing support hose - Elevate legs above heart level at night  Fibromyalgia Well-managed on current medications Lyrica  and Cymbalta . - Continue current medications - Request refills as needed  Muscle spasm of shoulders and neck Intermittent  muscle spasms in shoulders and neck. Currently using Flexeril  as needed. - Continue Flexeril  as needed  Thyroid  disorder Previously on Synthroid. Current thyroid  function tests normal. - Recheck thyroid  levels  Vitamin D  deficiency Currently taking over-the-counter vitamin D3 drops. - Recheck vitamin D  levels  Colorectal cancer screening Due for colonoscopy. History of polyps and family history of colorectal cancer. Insurance sent Cologuard kits, but colonoscopy is recommended due to history. - Referral for colonoscopy  Shingles vaccination Discussion about shingles vaccine. Shingrix is recommended over Zostavax due to better efficacy. Sister-in-law had shingles despite vaccination, but unclear if she received Shingrix. - Consider Shingrix vaccination at pharmacy  Pneumonia vaccination Up to date with Prevnar 20.      Return in about 6 months (around 09/07/2024) for recheck/follow-up.    Subjective:    Chief Complaint  Patient presents with   Medical Management of Chronic Issues    Pt in office for annual visit and fasting labs;     HPI Discussed the use of AI scribe software for clinical note transcription with the patient, who gave verbal consent to proceed.  History of Present Illness Alison Cline is a 68 year old female with arthritis and fibromyalgia who presents for follow-up of knee pain and recent lab results.  She has experienced significant improvement in her knee pain following a series of gel injections. The stabbing pain with each step has resolved, although some discomfort persists, which she attributes to arthritis. She is more active but still requires rest and icing after work.  Recent blood work and a heart scan were performed, and  the patient reports that one of the labs mentioned stage three kidney disease. She was fasting during the lab work, and her potassium levels were low, despite taking a potassium pill nightly. She also reports low vitamin  D levels and takes over-the-counter vitamin D3 drops.  She experiences significant ankle swelling by the end of the day, particularly in the left ankle, despite wearing support hose and being active throughout the day. She has a history of phlebitis and has worn support hose since 1975. No shortness of breath, chest pain, or difficulty breathing.  She has a history of thyroid  disorder and was on Synthroid for several years, but her thyroid  levels have been stable without medication. She takes Lyrica  and Cymbalta  for fibromyalgia, which she reports are effective. She occasionally uses Flexeril  for muscle spasms in her shoulders and neck.  She has a family history of colon cancer, with her sister having passed away from it. She had polyps removed during a colonoscopy over ten years ago and is due for another screening. No bleeding or stool changes.  She has a history of recurrent pneumonia since childhood but has not had pneumonia since receiving the pneumonia vaccine a few years ago.     Past Medical History:  Diagnosis Date   Allergy 1995   Anxiety    Arthritis    C5 & C6   Bipolar disorder (HCC)    Chronic kidney disease    Clotting disorder    Depression    Diabetes mellitus without complication (HCC)    Diabetes mellitus without complication (HCC)    Borderline on metformin, DC's for the last year   Disc herniation    DVT (deep venous thrombosis) (HCC)    Hypertension    Hypokalemia    Neuromuscular disorder (HCC)    Thyroid  disease    Hypothyroid    Past Surgical History:  Procedure Laterality Date   CESAREAN SECTION     X3   CESAREAN SECTION     x3   KNEE ARTHROSCOPY Left    KNEE SURGERY Left     Family History  Problem Relation Age of Onset   Cancer Mother        lukemia   Hypertension Mother    Other Mother        varicose veins   Birth defects Mother    Depression Mother    Hearing loss Mother    Hyperlipidemia Mother    Miscarriages / [place]  Mother    Obesity Mother    Varicose Veins Mother    Prostate cancer Father    Arthritis Father    Cancer Father    Colon cancer Sister    Learning disabilities Sister    Breast cancer Sister    Cancer Brother        renal   Heart attack Brother        X2   Heart disease Brother    Hypertension Brother    Heart attack Brother    Heart disease Brother    Drug abuse Brother    Early death Brother    ADD / ADHD Daughter    Anxiety disorder Daughter    Diabetes Maternal Aunt    Early death Brother    Early death Sister    Intellectual disability Sister    Obesity Daughter     Social History   Tobacco Use   Smoking status: Never   Smokeless tobacco: Never  Vaping Use   Vaping status: Never  Used  Substance Use Topics   Alcohol use: Yes    Alcohol/week: 4.0 standard drinks of alcohol    Types: 4 Glasses of wine per week    Comment: occ wine   Drug use: Never     Allergies  Allergen Reactions   Ambien [Zolpidem] Other (See Comments)    Sleep walk, eat, and drive.   Zolpidem Other (See Comments)    Sleep walks   Zyprexa [Olanzapine] Itching and Swelling    Causes throat to swell   Zyprexa [Olanzapine] Hives    Review of Systems NEGATIVE UNLESS OTHERWISE INDICATED IN HPI      Objective:     BP 122/62 (BP Location: Left Arm, Patient Position: Sitting, Cuff Size: Large)   Pulse 70   Temp 98.1 F (36.7 C) (Temporal)   Ht 5' 2.5 (1.588 m)   Wt 209 lb 12.8 oz (95.2 kg)   SpO2 96%   BMI 37.76 kg/m   Wt Readings from Last 3 Encounters:  03/10/24 209 lb 12.8 oz (95.2 kg)  03/04/24 209 lb (94.8 kg)  01/07/24 209 lb (94.8 kg)    BP Readings from Last 3 Encounters:  03/10/24 122/62  03/09/24 115/62  03/04/24 138/84     Physical Exam Vitals and nursing note reviewed.  Constitutional:      Appearance: Normal appearance. She is obese.  Eyes:     Extraocular Movements: Extraocular movements intact.     Conjunctiva/sclera: Conjunctivae normal.      Pupils: Pupils are equal, round, and reactive to light.  Cardiovascular:     Rate and Rhythm: Normal rate and regular rhythm.     Pulses: Normal pulses.     Heart sounds: No murmur heard. Pulmonary:     Effort: Pulmonary effort is normal.     Breath sounds: Normal breath sounds.  Musculoskeletal:     Right lower leg: No edema.     Left lower leg: No edema.  Skin:    Findings: No lesion or rash.  Neurological:     General: No focal deficit present.     Mental Status: She is alert and oriented to person, place, and time.  Psychiatric:        Mood and Affect: Mood normal.             Shina Wass M Akia Desroches, PA-C

## 2024-03-25 ENCOUNTER — Ambulatory Visit (HOSPITAL_BASED_OUTPATIENT_CLINIC_OR_DEPARTMENT_OTHER): Payer: Self-pay | Admitting: Cardiology

## 2024-04-07 ENCOUNTER — Encounter: Payer: Self-pay | Admitting: Physician Assistant

## 2024-04-07 NOTE — Telephone Encounter (Signed)
 Please see pt msg and advise recommendations

## 2024-04-16 ENCOUNTER — Telehealth: Admitting: Physician Assistant

## 2024-04-16 ENCOUNTER — Encounter: Payer: Self-pay | Admitting: Physician Assistant

## 2024-04-16 ENCOUNTER — Other Ambulatory Visit: Payer: Self-pay | Admitting: Physician Assistant

## 2024-04-16 ENCOUNTER — Telehealth: Payer: Self-pay | Admitting: Gastroenterology

## 2024-04-16 DIAGNOSIS — Z6837 Body mass index (BMI) 37.0-37.9, adult: Secondary | ICD-10-CM

## 2024-04-16 DIAGNOSIS — I10 Essential (primary) hypertension: Secondary | ICD-10-CM | POA: Diagnosis not present

## 2024-04-16 DIAGNOSIS — M797 Fibromyalgia: Secondary | ICD-10-CM

## 2024-04-16 NOTE — Progress Notes (Signed)
   Virtual Visit via Video Note  I connected with  Alison Cline  on 04/16/24 at  1:00 PM EDT by a video enabled telemedicine application and verified that I am speaking with the correct person using two identifiers.  Location: Patient: home Provider: Nature Conservation Officer at [employer] Persons present: Patient and myself   I discussed the limitations of evaluation and management by telemedicine and the availability of in person appointments. The patient expressed understanding and agreed to proceed.   History of Present Illness:  Discussed the use of AI scribe software for clinical note transcription with the patient, who gave verbal consent to proceed.  History of Present Illness Alison Cline is a 68 year old female with hypertension who presents for a virtual visit to discuss potential weight loss options.  She is motivated to pursue weight loss options due to her daughter's success with a weight loss medication. Her BMI is 37.8, and she is concerned about her weight, particularly due to family worries about stroke risk. She has not previously used weight loss medications. Her sisters are supportive and willing to assist financially with treatment costs.  Her hypertension is managed with amlodipine  10 mg and losartan  HCTZ, as reported by the patient. She has no history of diabetes or cardiac events, with previous cardiac symptoms attributed to anxiety and normal cardiology test results.  No sleep apnea or excessive daytime sleepiness. She reports loud snoring but has never undergone a sleep study. She is reluctant to use a CPAP machine if diagnosed with sleep apnea.  Her past psychiatric history includes a diagnosis of bipolar disorder and major depressive disorder, but she has not been under psychiatric care for about ten years and has self-discontinued medications. She experiences occasional mood swings but manages without medication.      Observations/Objective:   Gen: Awake, alert, no acute distress Resp: Breathing is even and non-labored Psych: calm/pleasant demeanor Neuro: Alert and Oriented x 3, + facial symmetry, speech is clear.   Assessment and Plan:  Assessment and Plan Assessment & Plan Morbid obesity BMI of 37.8. No diabetes or sleep apnea. Interested in weight loss options, specifically tirzepatide/semaglutide, inspired by her daughter's success. Insurance coverage is uncertain, and she is considering self-pay options. Discussed risks of compounded medications and benefits of FDA-regulated options. Potential for insurance coverage if moderate to severe sleep apnea is diagnosed, though she is hesitant about CPAP therapy. Discussed contraindications for tirzepatide, including thyroid  cancer, gallbladder disease, and severe bowel issues. - Will consider tirzepatide vials through [employer] self-pay direct pharmacy program. - Offered to order sleep study to evaluate for sleep apnea. She declines at this time, stating she would not want CPAP if diagnosed.  - Will discuss potential insurance coverage if sleep apnea is diagnosed. - Will send prescription to pharmacy if she decides to proceed.  Essential hypertension Well-controlled with amlodipine  10 mg and losartan  HCTZ. - Continue current antihypertensive regimen with amlodipine  and losartan  HCTZ.    Follow Up Instructions:    I discussed the assessment and treatment plan with the patient. The patient was provided an opportunity to ask questions and all were answered. The patient agreed with the plan and demonstrated an understanding of the instructions.   The patient was advised to call back or seek an in-person evaluation if the symptoms worsen or if the condition fails to improve as anticipated.  Augustine Brannick M Micheline Markes, PA-C

## 2024-04-16 NOTE — Telephone Encounter (Signed)
 Good afternoon Dr. Mollie,   PT is calling to find out if she can schedule for a colonoscopy or does she need  to schedule a follow up due to needing a post cardiac clearance. Please review and advise. Thank you.

## 2024-04-20 ENCOUNTER — Encounter: Payer: Self-pay | Admitting: Radiology

## 2024-04-24 ENCOUNTER — Ambulatory Visit: Admitting: Physician Assistant

## 2024-05-23 ENCOUNTER — Other Ambulatory Visit: Payer: Self-pay | Admitting: Physician Assistant

## 2024-05-23 DIAGNOSIS — M797 Fibromyalgia: Secondary | ICD-10-CM

## 2024-06-29 NOTE — Progress Notes (Signed)
 KARSTYN BIRKEY                                          MRN: [id number]   06/29/2024   The [facility] Quality Team Specialist reviewed this patient medical record for the purposes of chart review for care gap closure. The following were reviewed: abstraction for care gap closure-glycemic status assessment.    [facility]BCI Quality Team

## 2024-09-07 ENCOUNTER — Ambulatory Visit: Admitting: Physician Assistant

## 2025-01-12 ENCOUNTER — Ambulatory Visit
